# Patient Record
Sex: Female | Born: 1975
Health system: Southern US, Community
[De-identification: ages and names within clinical notes are randomized; demographics above are authoritative.]

## PROBLEM LIST (undated history)

## (undated) DIAGNOSIS — R87619 Unspecified abnormal cytological findings in specimens from cervix uteri: Secondary | ICD-10-CM

## (undated) DIAGNOSIS — N946 Dysmenorrhea, unspecified: Secondary | ICD-10-CM

## (undated) DIAGNOSIS — N939 Abnormal uterine and vaginal bleeding, unspecified: Secondary | ICD-10-CM

## (undated) DIAGNOSIS — R011 Cardiac murmur, unspecified: Secondary | ICD-10-CM

## (undated) HISTORY — DX: Unspecified abnormal cytological findings in specimens from cervix uteri: R87.619

## (undated) HISTORY — DX: Cardiac murmur, unspecified: R01.1

## (undated) HISTORY — DX: Abnormal uterine and vaginal bleeding, unspecified: N93.9

## (undated) HISTORY — PX: KNEE ARTHROSCOPY: SUR90

## (undated) HISTORY — DX: Dysmenorrhea, unspecified: N94.6

---

## 2001-05-24 DIAGNOSIS — R87619 Unspecified abnormal cytological findings in specimens from cervix uteri: Secondary | ICD-10-CM

## 2001-05-24 HISTORY — DX: Unspecified abnormal cytological findings in specimens from cervix uteri: R87.619

## 2001-05-24 HISTORY — PX: COLPOSCOPY: SHX161

## 2016-12-13 ENCOUNTER — Ambulatory Visit (INDEPENDENT_AMBULATORY_CARE_PROVIDER_SITE_OTHER): Payer: 59 | Admitting: Osteopathic Medicine

## 2016-12-13 ENCOUNTER — Encounter: Payer: Self-pay | Admitting: Osteopathic Medicine

## 2016-12-13 VITALS — BP 110/63 | HR 68 | Ht 70.0 in | Wt 185.0 lb

## 2016-12-13 DIAGNOSIS — Z8639 Personal history of other endocrine, nutritional and metabolic disease: Secondary | ICD-10-CM | POA: Diagnosis not present

## 2016-12-13 DIAGNOSIS — F418 Other specified anxiety disorders: Secondary | ICD-10-CM

## 2016-12-13 DIAGNOSIS — Z9852 Vasectomy status: Secondary | ICD-10-CM | POA: Insufficient documentation

## 2016-12-13 DIAGNOSIS — E049 Nontoxic goiter, unspecified: Secondary | ICD-10-CM | POA: Diagnosis not present

## 2016-12-13 DIAGNOSIS — Z0000 Encounter for general adult medical examination without abnormal findings: Secondary | ICD-10-CM

## 2016-12-13 DIAGNOSIS — E559 Vitamin D deficiency, unspecified: Secondary | ICD-10-CM | POA: Diagnosis not present

## 2016-12-13 MED ORDER — CLONAZEPAM 0.5 MG PO TABS: 0.2500 mg | ORAL_TABLET | Freq: Two times a day (BID) | ORAL | 0 refills | Status: DC | PRN

## 2016-12-13 NOTE — Progress Notes (Signed)
HPI: Felicia Jacobson is a 41 y.o. female  who presents to [place] today, 12/13/16,  for chief complaint of:  Chief Complaint  Patient presents with  . Establish Care      Patient here for annual physical / wellness exam.  See preventive care reviewed as below.  Recent labs reviewed in detail with the patient.   Additional concerns today include:   Scalp - folliculitis on occasion, bumps which pop and drain then crust over. Never a problem before few months ago went to water park. No flaking/itching, no scalp cysts.   Onychomycosis - would like referral to podiatry, topical and po meds haven't helped   Hx thyroid nodules, no abnormal labs, mild enlarged thyroid.   Anxiety: situational, on airplanes, requests limited Rx Benzo     Past medical, surgical, social and family history reviewed: Patient Active Problem List   Diagnosis Date Noted  . S/P vasectomy 12/13/2016   No past surgical history on file. Social History  Substance Use Topics  . Smoking status: Never Smoker  . Smokeless tobacco: Never Used  . Alcohol use No   No family history on file.   Current medication list and allergy/intolerance information reviewed:   No current outpatient prescriptions on file.   No current facility-administered medications for this visit.    Allergies not on file    Review of Systems:  Constitutional:  No  fever, no chills, No recent illness, No unintentional weight changes. No significant fatigue.   HEENT: No  headache, no vision change, no hearing change, No sore throat, No  sinus pressure  Cardiac: No  chest pain, No  pressure, No palpitations, No  Orthopnea  Respiratory:  No  shortness of breath. No  Cough  Gastrointestinal: No  abdominal pain, No  nausea, No  vomiting,  No  blood in stool, No  diarrhea, No  constipation   Musculoskeletal: No new myalgia/arthralgia  Genitourinary: No  incontinence, No  abnormal genital  bleeding, No abnormal genital discharge  Skin: +Rash - scapl as per HPI, No other wounds/concerning lesions  Hem/Onc: No  easy bruising/bleeding, No  abnormal lymph node  Endocrine: No cold intolerance,  No heat intolerance. No polyuria/polydipsia/polyphagia   Neurologic: No  weakness, No  dizziness, No  slurred speech/focal weakness/facial droop  Psychiatric: No  concerns with depression, No  concerns with anxiety, No sleep problems, No mood problems  Exam:  BP 110/63   Pulse 68   Ht 5\' 10"  (1.778 m)   Wt 185 lb (83.9 kg)   LMP 11/10/2016   BMI 26.54 kg/m   Constitutional: VS see above. General Appearance: alert, well-developed, well-nourished, NAD  Eyes: Normal lids and conjunctive, non-icteric sclera  Ears, Nose, Mouth, Throat: MMM, Normal external inspection ears/nares/mouth/lips/gums. TM normal bilaterally. Pharynx/tonsils no erythema, no exudate. Nasal mucosa normal.   Neck: No masses, trachea midline. Borderline thyroid enlargement. No tenderness/mass appreciated. No lymphadenopathy  Respiratory: Normal respiratory effort. no wheeze, no rhonchi, no rales  Cardiovascular: S1/S2 normal, no murmur, no rub/gallop auscultated. RRR. No lower extremity edema.   Gastrointestinal: Nontender, no masses. No hepatomegaly, no splenomegaly. No hernia appreciated. Bowel sounds normal. Rectal exam deferred.   Musculoskeletal: Gait normal. No clubbing/cyanosis of digits.   Neurological: Normal balance/coordination. No tremor. No cranial nerve deficit on limited exam.   Skin: warm, dry, intact. No rash/ulcer. No concerning nevi or subq nodules on limited exam.  Small healing pimple-like lesion on scalp behind L ear   Psychiatric:  Normal judgment/insight. Normal mood and affect. Oriented x3.     ASSESSMENT/PLAN:   Annual physical exam - Plan: CBC, COMPLETE METABOLIC PANEL WITH GFR, Lipid panel, TSH, VITAMIN D 25 Hydroxy (Vit-D Deficiency, Fractures), Magnesium  S/P vasectomy -  husband, obviously  Enlarged thyroid - Plan: TSH, US THYROID  Vitamin D deficiency - Plan: VITAMIN D 25 Hydroxy (Vit-D Deficiency, Fractures)  History of thyroid nodule - F/U US, request records.  - Plan: US THYROID  Situational anxiety - Plan: clonazePAM (KLONOPIN) 0.5 MG tablet    Patient Instructions   T-sal (salicylic acid) shampoo 2-3 times per week  Send referral to podiatry for toenail   Labs at your convenience   US thyroid    FEMALE PREVENTIVE CARE Updated 12/13/16   ANNUAL SCREENING/COUNSELING  Diet/Exercise - HEALTHY HABITS DISCUSSED TO DECREASE CV RISK History  Smoking Status  . Never Smoker  Smokeless Tobacco  . Never Used   History  Alcohol Use No  Minimal   Depression screen PHQ 2/9 12/13/2016  Decreased Interest 0  Down, Depressed, Hopeless 0  PHQ - 2 Score 0    Domestic violence concerns - no  HTN SCREENING - [place]  Sexually active in the past year - Yes with female.  Need/want STI testing today? - no  Concerns about libido or pain with sex? - no  Plans for pregnancy? - husband s/p vascectomy  INFECTIOUS DISEASE SCREENING  HIV - does not need  GC/CT - does not need  HepC - DOB [birth year] - does not need  TB - does not need  DISEASE SCREENING  Lipid - needs  DM2 - needs  Osteoporosis - women age 70+ - does not need  CANCER SCREENING  Cervical - does not need  Breast - does not need  Lung - does not need  Colon - does not need  ADULT VACCINATION  Influenza - annual vaccine recommended  Td - booster every 10 years   Zoster - option at 50, yes at 60+   PCV13 - was not indicated  PPSV23 - was not indicated  There is no immunization history on file for this patient.   OTHER  Fall - exercise and Vit D age 33+ - does not need  Consider ASA - age 56-59 - does not need  Will request records     Visit summary with medication list and pertinent instructions was printed for patient to  review. All questions at time of visit were answered - patient instructed to contact office with any additional concerns. ER/RTC precautions were reviewed with the patient. Follow-up plan: Return in about 1 year (around 12/13/2017) for [facility] or sooner if needed .  Note: Total time spent 20 minutes on problem-based portion of visit, greater than 50% of the visit was spent face-to-face counseling and coordinating care for the following: The primary encounter diagnosis was Annual physical exam. Diagnoses of S/P vasectomy, Enlarged thyroid, Vitamin D deficiency, History of thyroid nodule, and Situational anxiety were also pertinent to this visit.Marland Kitchen

## 2016-12-13 NOTE — Patient Instructions (Addendum)
   T-sal (salicylic acid) shampoo 2-3 times per week  Send referral to podiatry for toenail   Labs at your convenience   US thyroid

## 2017-04-21 ENCOUNTER — Encounter: Payer: Self-pay | Admitting: Obstetrics & Gynecology

## 2017-04-21 ENCOUNTER — Ambulatory Visit: Payer: 59 | Admitting: Obstetrics & Gynecology

## 2017-04-21 ENCOUNTER — Other Ambulatory Visit (HOSPITAL_COMMUNITY)
Admission: RE | Admit: 2017-04-21 | Discharge: 2017-04-21 | Disposition: A | Discharge status: Home / Self Care | Payer: 59 | Source: Ambulatory Visit | Attending: Obstetrics & Gynecology | Admitting: Obstetrics & Gynecology

## 2017-04-21 ENCOUNTER — Other Ambulatory Visit: Payer: Self-pay

## 2017-04-21 VITALS — BP 120/66 | HR 72 | Resp 16 | Ht 69.0 in | Wt 187.0 lb

## 2017-04-21 DIAGNOSIS — Z124 Encounter for screening for malignant neoplasm of cervix: Secondary | ICD-10-CM | POA: Insufficient documentation

## 2017-04-21 DIAGNOSIS — Z01419 Encounter for gynecological examination (general) (routine) without abnormal findings: Secondary | ICD-10-CM | POA: Diagnosis not present

## 2017-04-21 DIAGNOSIS — N926 Irregular menstruation, unspecified: Secondary | ICD-10-CM

## 2017-04-21 LAB — POCT URINE PREGNANCY: PREG TEST UR: NEGATIVE

## 2017-04-21 NOTE — Progress Notes (Signed)
41 y.o. [id number] MarriedCaucasianF here for annual exam/new patient exam.  Cycles have never been regular.  Feels her cycles are the most regular since her youngest child was born five years ago.  Her biggest concern is irregular spotting.  She spots mid cycle for three days.  She also has associated pelvic cramping/pain that seems to accompany the spotting.  Saw provider at [facility].  Felt she did not have a thorough evaluation.  Did have ultrasound.  Has no records with her.  Would like to start evaluation again.  Used Clomid with three pregnancies.    Moved from [place] due to husband's work.  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    H/O thyroid nodules.  Has done thyroid imaging.   Patient's last menstrual period was 04/02/2017.          Sexually active: Yes.    The current method of family planning is vasectomy.    Exercising: Yes.    burn boot camp, peloton bike Smoker:  no  Health Maintenance: Pap:  02/2016 normal  History of abnormal Pap:  Yes, normal  MMG:  Never TDaP:  ~ 5 years ago.  Hep C testing: not indicated Screening Labs: Screening labs, Hormone levels.   reports that  has never smoked. she has never used smokeless tobacco. She reports that she does not drink alcohol or use drugs.  Past Medical History:  Diagnosis Date  . Abnormal Pap smear of cervix 2003  . Abnormal uterine bleeding   . Dysmenorrhea   . Heart murmur     Past Surgical History:  Procedure Laterality Date  . COLPOSCOPY  2003  . KNEE ARTHROSCOPY      Current Outpatient Medications  Medication Sig Dispense Refill  . diphenhydrAMINE (BENADRYL) 25 MG tablet Take 25 mg by mouth every 8 (eight) hours as needed.    Marland Kitchen L-Theanine 100 MG CAPS Take by mouth daily.    . magnesium 30 MG tablet Take 30 mg by mouth daily.     No current facility-administered medications for this visit.     Family History  Problem Relation Age of Onset  . Hyperlipidemia Mother   .  Hypertension Mother   . Cancer Father   . Hyperlipidemia Father   . Hypertension Father   . Multiple myeloma Father     ROS:  Pertinent items are noted in HPI.  Otherwise, a comprehensive ROS was negative.  Exam:   BP 120/66 (BP Location: Right Arm, Patient Position: Sitting, Cuff Size: Normal)   Pulse 72   Resp 16   Ht _0  (1.753 m)   Wt 187 lb (84.8 kg)   LMP 04/02/2017   BMI 27.62 kg/m     Height: _1  (175.3 cm)  Ht Readings from Last 3 Encounters:  04/21/17 _2  (1.753 m)  12/13/16 _3  (1.778 m)    General appearance: alert, cooperative and appears stated age Head: Normocephalic, without obvious abnormality, atraumatic Neck: no adenopathy, supple, symmetrical, trachea midline and thyroid normal to inspection and palpation (I do not palpate enlarged or nodular thyroid today) Lungs: clear to auscultation bilaterally Breasts: normal appearance, no masses or tenderness Heart: regular rate and rhythm Abdomen: soft, non-tender; bowel sounds normal; no masses,  no organomegaly Extremities: extremities normal, atraumatic, no cyanosis or edema Skin: Skin color, texture, turgor normal. No rashes or lesions Lymph nodes: Cervical, supraclavicular, and axillary nodes normal. No abnormal inguinal nodes palpated Neurologic: Grossly normal   Pelvic:  External genitalia:  no lesions              Urethra:  normal appearing urethra with no masses, tenderness or lesions              Bartholins and Skenes: normal                 Vagina: normal appearing vagina with normal color and discharge, no lesions              Cervix: no lesions              Pap taken: Yes.   Bimanual Exam:  Uterus:  normal size, contour, position, consistency, mobility, non-tender              Adnexa: normal adnexa and no mass, fullness, tenderness               Rectovaginal: Confirms               Anus:  normal sphincter tone, no lesions  Chaperone was present for exam.  A:  Well Woman with normal  exam Mid cycle spotting with increased cramping Thyroid nodules  P:   Mammogram guidelines reviewed.  She will start MMG this year.  Information given.  Does not need my office to schedule. pap smear and HR HPV obtained today. Pt is going to return after cycle for SH[facility] TSH, free T4, FSH and estradiol obatined today Also, return annually or prn

## 2017-04-22 LAB — T4, FREE: Free T4: 1.28 ng/dL (ref 0.82–1.77)

## 2017-04-22 LAB — TSH: TSH: 2.52 u[IU]/mL (ref 0.450–4.500)

## 2017-04-22 LAB — FOLLICLE STIMULATING HORMONE: FSH: 2.4 m[IU]/mL

## 2017-04-22 LAB — ESTRADIOL: ESTRADIOL: 93.7 pg/mL

## 2017-04-26 LAB — CYTOLOGY - PAP
Diagnosis: NEGATIVE
HPV (WINDOPATH): NOT DETECTED

## 2017-05-09 ENCOUNTER — Telehealth: Payer: Self-pay | Admitting: Obstetrics & Gynecology

## 2017-05-09 ENCOUNTER — Telehealth: Payer: Self-pay | Admitting: *Deleted

## 2017-05-09 NOTE — Telephone Encounter (Signed)
Spoke with Felicia Jacobson. Felicia Jacobson requesting to move [facility] appt up to 12/18, currently scheduled for 12/20. Advised will have to review scheduling with Dr. Sabra Heck and return call. Felicia Jacobson is agreeable.   LMP 04/30/17 -day 12 of cycle 12/19  Dr. Sabra Heck -ok to keep [facility] as scheduled on 12/20 or schedule with next menses?

## 2017-05-09 NOTE — Telephone Encounter (Signed)
Whatever works for her is fine.  Thanks.

## 2017-05-09 NOTE — Telephone Encounter (Signed)
Left detailed message, ok per current dpr. Advised to keep [facility] scheduled for 05/12/17, no availability [facility] on 12/18. Return call to office at [phone] for any additional questions.   Routing to provider for final review. Patient is agreeable to disposition. Will close encounter.

## 2017-05-09 NOTE — Telephone Encounter (Signed)
Patient has a [facility] appointment 05/12/17 with Dr.Miller. Patient is asking if it's possible to move this [facility] to tomorrow 05/10/17?

## 2017-05-09 NOTE — Telephone Encounter (Signed)
Spoke with patient. Patient request to cancel [facility] scheduled for 12/20. Advised patient to return call to office with first day of menses for scheduling SHGM. Patient verbalizes understanding and is agreeable.   Routing to provider for final review. Patient is agreeable to disposition. Will close encounter.

## 2017-05-12 ENCOUNTER — Other Ambulatory Visit: Payer: 59 | Admitting: Obstetrics & Gynecology

## 2017-05-12 ENCOUNTER — Other Ambulatory Visit: Payer: 59

## 2017-06-02 ENCOUNTER — Telehealth: Payer: Self-pay | Admitting: Obstetrics & Gynecology

## 2017-06-02 NOTE — Telephone Encounter (Signed)
Spoke with patient. Patient started her menses on 05/31/2017 and would like to schedule her SHGM. Appointment scheduled for 06/09/2017 at 1 pm. Patient is agreeable to date and time.   Routing to provider for final review. Patient agreeable to disposition. Will close encounter.

## 2017-06-02 NOTE — Telephone Encounter (Signed)
Patient says she is needing to schedule a sonohysterogram.

## 2017-06-07 ENCOUNTER — Telehealth: Payer: Self-pay

## 2017-06-07 DIAGNOSIS — N926 Irregular menstruation, unspecified: Secondary | ICD-10-CM

## 2017-06-07 NOTE — Telephone Encounter (Signed)
Erroneous encounter

## 2017-06-09 ENCOUNTER — Other Ambulatory Visit: Payer: Self-pay | Admitting: Obstetrics & Gynecology

## 2017-06-09 ENCOUNTER — Ambulatory Visit (INDEPENDENT_AMBULATORY_CARE_PROVIDER_SITE_OTHER): Payer: 59 | Admitting: Obstetrics & Gynecology

## 2017-06-09 ENCOUNTER — Ambulatory Visit (INDEPENDENT_AMBULATORY_CARE_PROVIDER_SITE_OTHER): Payer: 59

## 2017-06-09 DIAGNOSIS — N926 Irregular menstruation, unspecified: Secondary | ICD-10-CM

## 2017-06-09 DIAGNOSIS — D251 Intramural leiomyoma of uterus: Secondary | ICD-10-CM

## 2017-06-09 NOTE — Progress Notes (Signed)
Pt left prior to consultation due to child need issue.  She was called with results.  Findings reviewed including fibroids.  Treatment options with OCPs, POPs, progesterone options, Mirena IUD, endometrial ablation, hysterectomy.  She does not want to do anything at this time.  Expressed appreciation of phone call.  Will consider and let me know if she changes her mind.

## 2017-06-11 ENCOUNTER — Encounter: Payer: Self-pay | Admitting: Obstetrics & Gynecology

## 2017-08-15 DIAGNOSIS — S46919A Strain of unspecified muscle, fascia and tendon at shoulder and upper arm level, unspecified arm, initial encounter: Secondary | ICD-10-CM | POA: Insufficient documentation

## 2018-01-25 DIAGNOSIS — M25561 Pain in right knee: Secondary | ICD-10-CM | POA: Insufficient documentation

## 2018-03-08 ENCOUNTER — Encounter: Payer: Self-pay | Admitting: Obstetrics & Gynecology

## 2018-05-05 ENCOUNTER — Other Ambulatory Visit: Payer: Self-pay | Admitting: Orthopedic Surgery

## 2018-06-16 DIAGNOSIS — I8311 Varicose veins of right lower extremity with inflammation: Secondary | ICD-10-CM | POA: Diagnosis not present

## 2018-06-16 DIAGNOSIS — I8312 Varicose veins of left lower extremity with inflammation: Secondary | ICD-10-CM | POA: Diagnosis not present

## 2018-06-29 ENCOUNTER — Ambulatory Visit: Payer: 59 | Admitting: Obstetrics & Gynecology

## 2018-06-30 ENCOUNTER — Ambulatory Visit (INDEPENDENT_AMBULATORY_CARE_PROVIDER_SITE_OTHER): Payer: BLUE CROSS/BLUE SHIELD | Admitting: Obstetrics & Gynecology

## 2018-06-30 ENCOUNTER — Encounter: Payer: Self-pay | Admitting: Obstetrics & Gynecology

## 2018-06-30 ENCOUNTER — Other Ambulatory Visit: Payer: Self-pay

## 2018-06-30 ENCOUNTER — Encounter: Payer: Self-pay | Admitting: Adult Health

## 2018-06-30 VITALS — BP 96/60 | HR 72 | Resp 16 | Ht 69.25 in | Wt 179.0 lb

## 2018-06-30 DIAGNOSIS — Z01419 Encounter for gynecological examination (general) (routine) without abnormal findings: Secondary | ICD-10-CM | POA: Diagnosis not present

## 2018-06-30 DIAGNOSIS — Z124 Encounter for screening for malignant neoplasm of cervix: Secondary | ICD-10-CM | POA: Diagnosis not present

## 2018-06-30 DIAGNOSIS — Z0000 Encounter for general adult medical examination without abnormal findings: Secondary | ICD-10-CM

## 2018-06-30 NOTE — Progress Notes (Signed)
43 y.o. [id number] Married White or Caucasian female here for annual exam.  Had an injury when doing push-ups.  Had a 50% tear in her right shoulder.  Also, had knee issues.  Had arthroscopic with a piece for bone abnormality that was removed.  This was done with Dr. Maureen Ralphs.  Is going to see provider at [facility] in a month.    Cycles are regular.   Spotting between cycles has significantly improved.  She is having dark spotting two or three days before her cycle.    Patient's last menstrual period was 06/22/2018 (exact date).          Sexually active: Yes.    The current method of family planning is vasectomy.    Exercising: Yes.    bike, weight lift  Smoker:  no  Health Maintenance: Pap:  04/21/17 Neg. HR HPV:neg  History of abnormal Pap:  yes MMG:  03/03/18 BIRADS1:Neg  TDaP:  2013 Screening Labs: here today - fasting    reports that she has never smoked. She has never used smokeless tobacco. She reports that she does not drink alcohol or use drugs.  Past Medical History:  Diagnosis Date  . Abnormal Pap smear of cervix 2003  . Abnormal uterine bleeding   . Dysmenorrhea   . Heart murmur     Past Surgical History:  Procedure Laterality Date  . COLPOSCOPY  2003  . KNEE ARTHROSCOPY Right    1997, 2014, 02/2018    Current Outpatient Medications  Medication Sig Dispense Refill  . diphenhydrAMINE (BENADRYL) 25 MG tablet Take 25 mg by mouth every 8 (eight) hours as needed.    . gabapentin (NEURONTIN) 300 MG capsule Take 1 capsule by mouth at bedtime.    . magnesium 30 MG tablet Take 30 mg by mouth daily.     No current facility-administered medications for this visit.     Family History  Problem Relation Age of Onset  . Hyperlipidemia Mother   . Hypertension Mother   . Cancer Father   . Hyperlipidemia Father   . Hypertension Father   . Multiple myeloma Father     Review of Systems  All other systems reviewed and are negative.   Exam:   BP 96/60 (BP Location: Left Arm,  Patient Position: Sitting, Cuff Size: Normal)   Pulse 72   Resp 16   Ht 5' 9.25" (1.759 m)   Wt 179 lb (81.2 kg)   LMP 06/22/2018 (Exact Date)   BMI 26.24 kg/m   Height: 5' 9.25" (175.9 cm)  Ht Readings from Last 3 Encounters:  06/30/18 5' 9.25" (1.759 m)  04/21/17 '5\' 9"'  (1.753 m)  12/13/16 '5\' 10"'  (1.778 m)    General appearance: alert, cooperative and appears stated age Head: Normocephalic, without obvious abnormality, atraumatic Neck: no adenopathy, supple, symmetrical, trachea midline and thyroid normal to inspection and palpation Lungs: clear to auscultation bilaterally Breasts: normal appearance, no masses or tenderness Heart: regular rate and rhythm Abdomen: soft, non-tender; bowel sounds normal; no masses,  no organomegaly Extremities: extremities normal, atraumatic, no cyanosis or edema Skin: Skin color, texture, turgor normal. No rashes or lesions Lymph nodes: Cervical, supraclavicular, and axillary nodes normal. No abnormal inguinal nodes palpated Neurologic: Grossly normal   Pelvic: External genitalia:  no lesions              Urethra:  normal appearing urethra with no masses, tenderness or lesions              Bartholins  and Skenes: normal                 Vagina: normal appearing vagina with normal color and discharge, no lesions              Cervix: no lesions              Pap taken: No. Bimanual Exam:  Uterus:  normal size, contour, position, consistency, mobility, non-tender              Adnexa: normal adnexa and no mass, fullness, tenderness               Rectovaginal: Confirms               Anus:  normal sphincter tone, no lesions  Chaperone was present for exam.  A:  Well Woman with normal exam H/O midcycle spotting that has significantly improved Two small intramural fibroids H/O thyroid nodules Partial rotator cuff repair Right knee pain  P:   Mammogram guidelines reviewed.   pap smear with neg HR HPV 11/18.  Not indicated. Lab work obtained  today:  Vit D, CBC, CMP, lipids TCM for breast cancer showed risk to be slightly greater than 20%.  D/w pt increased screening with MRI.  She is interested in this.  Does not meet criteria at this time for genetic testing.  (two grandparents on opposite sides of family with breast cancer after age 94).  Reviewed current recommendations with pt. Return annually or prn

## 2018-07-01 LAB — CBC
Hematocrit: 41.3 % (ref 34.0–46.6)
Hemoglobin: 13.7 g/dL (ref 11.1–15.9)
MCH: 28.8 pg (ref 26.6–33.0)
MCHC: 33.2 g/dL (ref 31.5–35.7)
MCV: 87 fL (ref 79–97)
PLATELETS: 200 10*3/uL (ref 150–450)
RBC: 4.75 x10E6/uL (ref 3.77–5.28)
RDW: 12.3 % (ref 11.7–15.4)
WBC: 5.1 10*3/uL (ref 3.4–10.8)

## 2018-07-01 LAB — COMPREHENSIVE METABOLIC PANEL
A/G RATIO: 2.1 (ref 1.2–2.2)
ALBUMIN: 4.6 g/dL (ref 3.8–4.8)
ALT: 19 IU/L (ref 0–32)
AST: 21 IU/L (ref 0–40)
Alkaline Phosphatase: 45 IU/L (ref 39–117)
BILIRUBIN TOTAL: 0.4 mg/dL (ref 0.0–1.2)
BUN/Creatinine Ratio: 21 (ref 9–23)
BUN: 16 mg/dL (ref 6–24)
CHLORIDE: 102 mmol/L (ref 96–106)
CO2: 27 mmol/L (ref 20–29)
Calcium: 9.2 mg/dL (ref 8.7–10.2)
Creatinine, Ser: 0.78 mg/dL (ref 0.57–1.00)
GFR calc non Af Amer: 95 mL/min/{1.73_m2} (ref >59)
GFR, EST AFRICAN AMERICAN: 109 mL/min/{1.73_m2} (ref >59)
GLOBULIN, TOTAL: 2.2 g/dL (ref 1.5–4.5)
Glucose: 73 mg/dL (ref 65–99)
Potassium: 4 mmol/L (ref 3.5–5.2)
Sodium: 142 mmol/L (ref 134–144)
Total Protein: 6.8 g/dL (ref 6.0–8.5)

## 2018-07-01 LAB — LIPID PANEL
CHOLESTEROL TOTAL: 193 mg/dL (ref 100–199)
Chol/HDL Ratio: 3.1 ratio (ref 0.0–4.4)
HDL: 62 mg/dL (ref >39)
LDL Calculated: 123 mg/dL — ABNORMAL HIGH (ref 0–99)
Triglycerides: 41 mg/dL (ref 0–149)
VLDL Cholesterol Cal: 8 mg/dL (ref 5–40)

## 2018-07-01 LAB — VITAMIN D 25 HYDROXY (VIT D DEFICIENCY, FRACTURES): VIT D 25 HYDROXY: 42.1 ng/mL (ref 30.0–100.0)

## 2018-07-18 DIAGNOSIS — M25561 Pain in right knee: Secondary | ICD-10-CM | POA: Diagnosis not present

## 2018-07-18 DIAGNOSIS — M1711 Unilateral primary osteoarthritis, right knee: Secondary | ICD-10-CM | POA: Diagnosis not present

## 2018-07-18 DIAGNOSIS — M25461 Effusion, right knee: Secondary | ICD-10-CM | POA: Diagnosis not present

## 2018-07-18 DIAGNOSIS — M11261 Other chondrocalcinosis, right knee: Secondary | ICD-10-CM | POA: Diagnosis not present

## 2018-08-02 DIAGNOSIS — M1711 Unilateral primary osteoarthritis, right knee: Secondary | ICD-10-CM | POA: Diagnosis not present

## 2018-08-02 DIAGNOSIS — M25461 Effusion, right knee: Secondary | ICD-10-CM | POA: Diagnosis not present

## 2018-08-02 DIAGNOSIS — M11261 Other chondrocalcinosis, right knee: Secondary | ICD-10-CM | POA: Diagnosis not present

## 2018-08-02 DIAGNOSIS — M25561 Pain in right knee: Secondary | ICD-10-CM | POA: Diagnosis not present

## 2018-08-02 DIAGNOSIS — G8929 Other chronic pain: Secondary | ICD-10-CM | POA: Diagnosis not present

## 2018-11-06 DIAGNOSIS — M1711 Unilateral primary osteoarthritis, right knee: Secondary | ICD-10-CM | POA: Diagnosis not present

## 2018-11-13 DIAGNOSIS — M25561 Pain in right knee: Secondary | ICD-10-CM | POA: Diagnosis not present

## 2018-11-13 DIAGNOSIS — M7050 Other bursitis of knee, unspecified knee: Secondary | ICD-10-CM | POA: Diagnosis not present

## 2018-11-13 DIAGNOSIS — G8929 Other chronic pain: Secondary | ICD-10-CM | POA: Diagnosis not present

## 2018-12-18 DIAGNOSIS — M25561 Pain in right knee: Secondary | ICD-10-CM | POA: Diagnosis not present

## 2018-12-18 DIAGNOSIS — M7050 Other bursitis of knee, unspecified knee: Secondary | ICD-10-CM | POA: Diagnosis not present

## 2018-12-18 DIAGNOSIS — G8929 Other chronic pain: Secondary | ICD-10-CM | POA: Diagnosis not present

## 2019-03-16 ENCOUNTER — Encounter: Payer: Self-pay | Admitting: Obstetrics & Gynecology

## 2019-03-16 DIAGNOSIS — Z1231 Encounter for screening mammogram for malignant neoplasm of breast: Secondary | ICD-10-CM | POA: Diagnosis not present

## 2019-03-16 DIAGNOSIS — Z803 Family history of malignant neoplasm of breast: Secondary | ICD-10-CM | POA: Diagnosis not present

## 2019-03-27 DIAGNOSIS — D229 Melanocytic nevi, unspecified: Secondary | ICD-10-CM | POA: Diagnosis not present

## 2019-03-27 DIAGNOSIS — L700 Acne vulgaris: Secondary | ICD-10-CM | POA: Diagnosis not present

## 2019-03-27 DIAGNOSIS — Z1283 Encounter for screening for malignant neoplasm of skin: Secondary | ICD-10-CM | POA: Diagnosis not present

## 2019-05-07 DIAGNOSIS — R609 Edema, unspecified: Secondary | ICD-10-CM | POA: Diagnosis not present

## 2019-05-25 HISTORY — PX: OTHER SURGICAL HISTORY: SHX169

## 2019-05-27 DIAGNOSIS — Z20828 Contact with and (suspected) exposure to other viral communicable diseases: Secondary | ICD-10-CM | POA: Diagnosis not present

## 2019-06-01 DIAGNOSIS — G44201 Tension-type headache, unspecified, intractable: Secondary | ICD-10-CM | POA: Diagnosis not present

## 2019-06-01 DIAGNOSIS — J069 Acute upper respiratory infection, unspecified: Secondary | ICD-10-CM | POA: Diagnosis not present

## 2019-07-13 ENCOUNTER — Other Ambulatory Visit: Payer: Self-pay

## 2019-07-16 ENCOUNTER — Other Ambulatory Visit: Payer: Self-pay

## 2019-07-16 ENCOUNTER — Ambulatory Visit (INDEPENDENT_AMBULATORY_CARE_PROVIDER_SITE_OTHER): Payer: BC Managed Care – PPO | Admitting: Obstetrics & Gynecology

## 2019-07-16 ENCOUNTER — Other Ambulatory Visit (HOSPITAL_COMMUNITY)
Admission: RE | Admit: 2019-07-16 | Discharge: 2019-07-16 | Disposition: A | Discharge status: Home / Self Care | Payer: BC Managed Care – PPO | Source: Ambulatory Visit | Attending: Obstetrics & Gynecology | Admitting: Obstetrics & Gynecology

## 2019-07-16 ENCOUNTER — Telehealth: Payer: Self-pay | Admitting: *Deleted

## 2019-07-16 ENCOUNTER — Encounter: Payer: Self-pay | Admitting: Obstetrics & Gynecology

## 2019-07-16 VITALS — BP 116/60 | HR 76 | Temp 97.9°F | Resp 12 | Ht 68.75 in | Wt 186.0 lb

## 2019-07-16 DIAGNOSIS — Z124 Encounter for screening for malignant neoplasm of cervix: Secondary | ICD-10-CM | POA: Insufficient documentation

## 2019-07-16 DIAGNOSIS — Z01419 Encounter for gynecological examination (general) (routine) without abnormal findings: Secondary | ICD-10-CM | POA: Diagnosis not present

## 2019-07-16 DIAGNOSIS — R6882 Decreased libido: Secondary | ICD-10-CM | POA: Diagnosis not present

## 2019-07-16 DIAGNOSIS — Z8639 Personal history of other endocrine, nutritional and metabolic disease: Secondary | ICD-10-CM

## 2019-07-16 MED ORDER — NITROFURANTOIN MONOHYD MACRO 100 MG PO CAPS: ORAL_CAPSULE | ORAL | 2 refills | Status: DC

## 2019-07-16 NOTE — Telephone Encounter (Signed)
Order placed for thyroid US.  Call placed to [facility], spoke with Clarise Cruz. Patient scheduled for Thyroid US on 07/19/19 at 4pm, arrive at 3:40pm. [address] location.   Call to patient, left detailed message, ok per dpr. Advised of appt as seen above, return call to office if any additional questions. If you need to make any changes to your appt contact [facility] directly at [phone].   Patient placed in [facility] hold.   Routing to provider for final review. Patient is agreeable to disposition. Will close encounter.  Cc: Magdalene Patricia

## 2019-07-16 NOTE — Progress Notes (Signed)
44 y.o. [id number] Married White or Caucasian female here for annual exam.  Did have a rotator cuff tear in her shoulder.  Did PT and things are so much better.  Also was having knee problems.  Saw a person at [facility].  Doing exercises.  This is much better.     Exercising with a pelaton bike and treadmill.    Cycles are regular.  Mid cycle spotting has resolved this past year.  Flow lasts about 3 days.    Having increased frequency of UTIs.  Inset is fast.  Voiding after intercourse used to be enough.  This is not enough now and she is having UTIs that are related to intercourse.  Is going to [facility] or other [facility] like locations when having UTIs.  When urine cultures are done, they are usually e coli positive urine.    Having some decreased libido that she thinks is related to concerns about UTI but would like hormonal testing.    Patient's last menstrual period was 07/08/2019.          Sexually active: Yes.    The current method of family planning is vasectomy.    Exercising: Yes.    pelaton bike, walking, weights Smoker:  no  Health Maintenance: Pap:  04/21/17 Neg. HR HPV:neg  History of abnormal Pap:  yes MMG:  03/16/19 BIRADS 1 negative/density b TDaP:  2013 Screening Labs: PCP -- patient would like to check hormone levels   reports that she has never smoked. She has never used smokeless tobacco. She reports that she does not drink alcohol or use drugs.  Past Medical History:  Diagnosis Date  . Abnormal Pap smear of cervix 2003  . Abnormal uterine bleeding   . Dysmenorrhea   . Heart murmur     Past Surgical History:  Procedure Laterality Date  . COLPOSCOPY  2003  . KNEE ARTHROSCOPY Right    1997, 2014, 02/2018    Current Outpatient Medications  Medication Sig Dispense Refill  . diphenhydrAMINE (BENADRYL) 25 MG tablet Take 25 mg by mouth every 8 (eight) hours as needed.    . tretinoin (RETIN-A) 0.025 % cream Apply a thin layer to the face nightly as tolerated.     No  current facility-administered medications for this visit.    Family History  Problem Relation Age of Onset  . Hyperlipidemia Mother   . Hypertension Mother   . Cancer Father   . Hyperlipidemia Father   . Hypertension Father   . Multiple myeloma Father   . Breast cancer Maternal Grandmother 58  . Breast cancer Paternal Grandmother 36    Review of Systems  All other systems reviewed and are negative.   Exam:   BP 116/60 (BP Location: Right Arm, Patient Position: Sitting, Cuff Size: Normal)   Pulse 76   Temp 97.9 F (36.6 C) (Temporal)   Resp 12   Ht 5' 8.75" (1.746 m)   Wt 186 lb (84.4 kg)   LMP 07/08/2019   BMI 27.67 kg/m     Height: 5' 8.75" (174.6 cm)  Ht Readings from Last 3 Encounters:  07/16/19 5' 8.75" (1.746 m)  06/30/18 5' 9.25" (1.759 m)  04/21/17 '5\' 9"'  (1.753 m)   General appearance: alert, cooperative and appears stated age Head: Normocephalic, without obvious abnormality, atraumatic Neck: no adenopathy, supple, symmetrical, trachea midline and thyroid normal to inspection and palpation Lungs: clear to auscultation bilaterally Breasts: normal appearance, no masses or tenderness Heart: regular rate and rhythm Abdomen:  soft, non-tender; bowel sounds normal; no masses,  no organomegaly Extremities: extremities normal, atraumatic, no cyanosis or edema Skin: Skin color, texture, turgor normal. No rashes or lesions Lymph nodes: Cervical, supraclavicular, and axillary nodes normal. No abnormal inguinal nodes palpated Neurologic: Grossly normal   Pelvic: External genitalia:  no lesions              Urethra:  normal appearing urethra with no masses, tenderness or lesions              Bartholins and Skenes: normal                 Vagina: normal appearing vagina with normal color and discharge, no lesions              Cervix: no lesions              Pap taken: Yes.   Bimanual Exam:  Uterus:  normal size, contour, position, consistency, mobility, non-tender               Adnexa: normal adnexa and no mass, fullness, tenderness               Rectovaginal: Confirms               Anus:  normal sphincter tone, no lesions  Chaperone, Terence Lux, CMA, was present for exam.  A:  Well Woman with normal exam H/o midcycle spotting that has resolved H/o intramural fibroids H/o thyroid nodules Family hx of breast cancer in MGM and PGM  P:   Mammogram guidelines reviewed.  Limited/abbreviated breast MRI discussed with pt.  Tyrer Cusick model risk is 13.6% pap smear and HR HPV obtained today Thyroid ultrasound ordered for pt Estradiol, total testosterone, progesterone level obtained today Rx for macrobid 14m post coitally.  #30/3RF. Return annually or prn

## 2019-07-16 NOTE — Telephone Encounter (Signed)
-----   Message from Megan Salon, MD sent at 07/16/2019  1:51 PM EST ----- Regarding: thyroid ultrasound Felicia Jacobson, Could you schedule a thyroid ultrasound for this pt.  H/o thyroid nodules.  Afternoon appts are better.  Thanks.  Vinnie Level

## 2019-07-17 LAB — CYTOLOGY - PAP
Adequacy: ABSENT
Comment: NEGATIVE
Diagnosis: NEGATIVE
High risk HPV: NEGATIVE

## 2019-07-17 LAB — ESTRADIOL: Estradiol: 86 pg/mL

## 2019-07-17 LAB — FOLLICLE STIMULATING HORMONE: FSH: 3.7 m[IU]/mL

## 2019-07-18 ENCOUNTER — Other Ambulatory Visit: Payer: Self-pay

## 2019-07-18 ENCOUNTER — Encounter: Payer: Self-pay | Admitting: Osteopathic Medicine

## 2019-07-18 ENCOUNTER — Ambulatory Visit (INDEPENDENT_AMBULATORY_CARE_PROVIDER_SITE_OTHER): Payer: BC Managed Care – PPO | Admitting: Osteopathic Medicine

## 2019-07-18 VITALS — BP 98/66 | HR 75 | Temp 97.4°F | Ht 68.0 in | Wt 188.4 lb

## 2019-07-18 DIAGNOSIS — L608 Other nail disorders: Secondary | ICD-10-CM

## 2019-07-18 DIAGNOSIS — Z0000 Encounter for general adult medical examination without abnormal findings: Secondary | ICD-10-CM | POA: Diagnosis not present

## 2019-07-18 DIAGNOSIS — E559 Vitamin D deficiency, unspecified: Secondary | ICD-10-CM

## 2019-07-18 DIAGNOSIS — E049 Nontoxic goiter, unspecified: Secondary | ICD-10-CM | POA: Diagnosis not present

## 2019-07-18 DIAGNOSIS — Z9189 Other specified personal risk factors, not elsewhere classified: Secondary | ICD-10-CM

## 2019-07-18 DIAGNOSIS — Z807 Family history of other malignant neoplasms of lymphoid, hematopoietic and related tissues: Secondary | ICD-10-CM

## 2019-07-18 MED ORDER — GABAPENTIN (ONCE-DAILY) 300 MG PO TABS: 150.0000 mg | ORAL_TABLET | Freq: Every evening | ORAL | 1 refills | Status: DC | PRN

## 2019-07-18 NOTE — Patient Instructions (Addendum)
General Preventive Care  Most recent routine screening lipids/other labs: ordered  Everyone should have blood pressure checked once per year.   Tobacco: don't!  Alcohol: responsible moderation is ok for most adults - if you have concerns about your alcohol intake, please talk to me!   Exercise: as tolerated to reduce risk of cardiovascular disease and diabetes. Strength training will also prevent osteoporosis.   Mental health: if need for mental health care (medicines, counseling, other), or concerns about moods, please let me know!   Sexual health: if need for STD testing, or if concerns with libido/pain problems, please let me know! If you need to discuss your birth control options, please let me know!   Advanced Directive: Living Will and/or Healthcare Power of Attorney recommended for all adults, regardless of age or health.  Vaccines  Flu vaccine: recommended for almost everyone, every fall.   Shingles vaccine: Shingrix recommended after age 83.   Pneumonia vaccines: Prevnar and Pneumovax recommended after age 39, or sooner if certain medical conditions.  Tetanus booster: Tdap recommended every 10 years.  Cancer screenings   Colon cancer screening: recommended for everyone at age 39, but some folks need a colonoscopy sooner if risk factors   Breast cancer screening: mammogram recommended at age 36 every other year at least, and annually after age 73.   Cervical cancer screening: Pap every 1 to 5 years depending on age and other risk factors. Can usually stop at age 7 or w/ hysterectomy.   Lung cancer screening: not needed for non-smokers  Infection screenings . HIV: recommended screening at least once age 52-65, more often as needed. . Gonorrhea/Chlamydia: screening as needed . Hepatitis C: recommended for anyone born 35-1964 . TB: certain at-risk populations, or depending on work requirements and/or travel history Other . Bone Density Test: recommended for women at  age 3

## 2019-07-18 NOTE — Progress Notes (Signed)
HPI: Felicia Jacobson is a 44 y.o. female who  has a past medical history of Abnormal Pap smear of cervix (2003), Abnormal uterine bleeding, Dysmenorrhea, and Heart murmur.  she presents to [facility] today, 07/18/19,  for chief complaint of: Annual physical   Patient here for annual physical / wellness exam.  See preventive care reviewed as below.   Additional concerns today include:  Lack of sleep becoming an issue, she reports waking up around 2 or 4 and unable to get back to sleep. Previously on gabapentin and this helped     Past medical, surgical, social and family history reviewed:  Patient Active Problem List   Diagnosis Date Noted  . Pain in right knee 01/25/2018  . Shoulder strain 08/15/2017  . S/P vasectomy 12/13/2016  . Vitamin D deficiency 12/13/2016  . Enlarged thyroid 12/13/2016   Past Surgical History:  Procedure Laterality Date  . COLPOSCOPY  2003  . KNEE ARTHROSCOPY Right    1997, 2014, 02/2018   Social History   Tobacco Use  . Smoking status: Never Smoker  . Smokeless tobacco: Never Used  Substance Use Topics  . Alcohol use: No   Family History  Problem Relation Age of Onset  . Hyperlipidemia Mother   . Hypertension Mother   . Cancer Father   . Hyperlipidemia Father   . Hypertension Father   . Multiple myeloma Father   . Breast cancer Maternal Grandmother 20  . Breast cancer Paternal Grandmother 37     Current medication list and allergy/intolerance information reviewed:    Current Outpatient Medications  Medication Sig Dispense Refill  . tretinoin (RETIN-A) 0.025 % cream Apply a thin layer to the face nightly as tolerated.    . diphenhydrAMINE (BENADRYL) 25 MG tablet Take 25 mg by mouth every 8 (eight) hours as needed.     No current facility-administered medications for this visit.   No Known Allergies      Review of Systems:  Constitutional:  No  fever, no chills, No recent illness, No  unintentional weight changes. No significant fatigue.   HEENT: No  headache, no vision change  Cardiac: No  chest pain, No  pressure, No palpitations, No  Orthopnea  Respiratory:  No  shortness of breath. No  Cough  Gastrointestinal: No  abdominal pain, No  nausea  Musculoskeletal: No new myalgia/arthralgia  Skin: No  Rash, No other wounds/concerning lesions  Hem/Onc: No  easy bruising/bleeding, No  abnormal lymph node  Endocrine: No cold intolerance,  No heat intolerance. No polyuria/polydipsia/polyphagia   Neurologic: No  weakness, No  dizziness  Psychiatric: No  concerns with depression, No  concerns with anxiety, +sleep problems, No mood problems  Exam:  BP 98/66   Pulse 75   Temp (!) 97.4 F (36.3 C)   Ht '5\' 8"'  (1.727 m)   Wt 188 lb 6.4 oz (85.5 kg)   LMP 07/08/2019   SpO2 98%   BMI 28.65 kg/m   Constitutional: VS see above. General Appearance: alert, well-developed, well-nourished, NAD  Eyes: Normal lids and conjunctive, non-icteric sclera  Ears, Nose, Mouth, Throat: TM normal bilaterally.  Neck: No masses, trachea midline. No thyroid enlargement. No tenderness/mass appreciated. No lymphadenopathy  Respiratory: Normal respiratory effort. no wheeze, no rhonchi, no rales  Cardiovascular: S1/S2 normal, no murmur, no rub/gallop auscultated. RRR. No lower extremity edema.  Gastrointestinal: Nontender, no masses. No hepatomegaly, no splenomegaly. No hernia appreciated. Bowel sounds normal. Rectal exam deferred.   Musculoskeletal: Gait  normal. No clubbing/cyanosis of digits.   Neurological: Normal balance/coordination. No tremor. No cranial nerve deficit on limited exam. Motor and sensation intact and symmetric. Cerebellar reflexes intact.   Skin: warm, dry, intact. No rash/ulcer. No concerning nevi or subq nodules on limited exam.    Psychiatric: Normal judgment/insight. Normal mood and affect. Oriented x3.    Results for orders placed or performed in visit on  07/16/19 (from the past 72 hour(s))  Cytology - PAP( [place])     Status: None   Collection Time: 07/16/19  1:39 PM  Result Value Ref Range   High risk HPV Negative    Adequacy      Satisfactory for evaluation; transformation zone component ABSENT.   Diagnosis      - Negative for intraepithelial lesion or malignancy (NILM)   Comment Normal Reference Range HPV - Negative   Follicle stimulating hormone     Status: None   Collection Time: 07/16/19  2:30 PM  Result Value Ref Range   FSH 3.7 mIU/mL    Comment:                     Adult Female:                       Follicular phase      3.5 -  12.5                       Ovulation phase       4.7 -  21.5                       Luteal phase          1.7 -   7.7                       Postmenopausal       25.8 - 134.8   Estradiol     Status: None   Collection Time: 07/16/19  2:30 PM  Result Value Ref Range   Estradiol 86.0 pg/mL    Comment:                     Adult Female:                       Follicular phase   08.1 -   166.0                       Ovulation phase    85.8 -   498.0                       Luteal phase       43.8 -   211.0                       Postmenopausal     <6.0 -    54.7                     Pregnancy                       1st trimester     215.0 - >4300.0 Roche ECLIA methodology     No results found.           ASSESSMENT/PLAN: The primary encounter  diagnosis was Annual physical exam. Diagnoses of Family history of multiple myeloma, Vitamin D deficiency, Enlarged thyroid, Relies on partner vasectomy for contraception, and Toenail deformity were also pertinent to this visit.  Orders Placed This Encounter  Procedures  . CBC  . COMPLETE METABOLIC PANEL WITH GFR  . Lipid panel  . TSH  . VITAMIN D 25 Hydroxy (Vit-D Deficiency, Fractures)  . Ambulatory referral to [facility] ordered this encounter  Medications  . Gabapentin, Once-Daily, 300 MG TABS    Sig: Take 150 mg by mouth at bedtime as  needed.    Dispense:  90 tablet    Refill:  1   Patient Instructions  General Preventive Care  Most recent routine screening lipids/other labs: ordered  Everyone should have blood pressure checked once per year.   Tobacco: don't!3Alcohol: responsible moderation is ok for most adults - if you have concerns about your alcohol intake, please talk to me!   Exercise: as tolerated to reduce risk of cardiovascular disease and diabetes. Strength training will also prevent osteoporosis.   Mental health: if need for mental health care (medicines, counseling, other), or concerns about moods, please let me know!   Sexual health: if need for STD testing, or if concerns with libido/pain problems, please let me know! If you need to discuss your birth control options, please let me know!   Advanced Directive: Living Will and/or Healthcare Power of Attorney recommended for all adults, regardless of age or health.  Vaccines  Flu vaccine: recommended for almost everyone, every fall.   Shingles vaccine: Shingrix recommended after age 71.   Pneumonia vaccines: Prevnar and Pneumovax recommended after age 45, or sooner if certain medical conditions.  Tetanus booster: Tdap recommended every 10 years.  Cancer screenings   Colon cancer screening: recommended for everyone at age 68, but some folks need a colonoscopy sooner if risk factors   Breast cancer screening: mammogram recommended at age 5 every other year at least, and annually after age 75.   Cervical cancer screening: Pap every 1 to 5 years depending on age and other risk factors. Can usually stop at age 103 or w/ hysterectomy.   Lung cancer screening: not needed for non-smokers  Infection screenings . HIV: recommended screening at least once age 53-65, more often as needed. . Gonorrhea/Chlamydia: screening as needed . Hepatitis C: recommended for anyone born 73-1964 . TB: certain at-risk populations, or depending on work requirements  and/or travel history Other . Bone Density Test: recommended for women at age 66        Visit summary with medication list and pertinent instructions was printed for patient to review. All questions at time of visit were answered - patient instructed to contact office with any additional concerns or updates. ER/RTC precautions were revi  Please note: voice recognition software was used to produce this document, and typos may escape review. Please contact Dr. Sheppard Coil for any needed clarifications.     Follow-up plan: Return in about 1 year (around 07/17/2020) for [place] (call week prior to visit for lab orders).

## 2019-07-19 ENCOUNTER — Telehealth: Payer: Self-pay | Admitting: Osteopathic Medicine

## 2019-07-19 ENCOUNTER — Inpatient Hospital Stay: Admission: RE | Admit: 2019-07-19 | Payer: Self-pay | Source: Ambulatory Visit

## 2019-07-19 LAB — TESTOSTERONE, TOTAL, LC/MS/MS: Testosterone, total: 15.9 ng/dL

## 2019-07-19 NOTE — Telephone Encounter (Signed)
[facility] calls and states that you sent over yesterday Gabapentin 300mg  taking 150mg  daily.  Gabapentin 300mg  only comes in capsule so can't be halfed.  She called patient to let her know and she states she told you wrong and it was Gabapentin 600mg  and she halfed these to make 300mg .  Can you send over that dose.  KG LPN

## 2019-07-20 MED ORDER — GABAPENTIN 600 MG PO TABS: 300.0000 mg | ORAL_TABLET | Freq: Every day | ORAL | 1 refills | Status: DC

## 2019-07-20 NOTE — Telephone Encounter (Signed)
Ok sent 600 mg tabs to take 300 mg qhs as needed

## 2019-07-20 NOTE — Telephone Encounter (Signed)
Forwarding to provider for review.

## 2019-07-20 NOTE — Addendum Note (Signed)
Addended by: Maryla Morrow on: 07/20/2019 12:51 PM   Modules accepted: Orders

## 2019-07-20 NOTE — Telephone Encounter (Signed)
Pharmacy called again  8:52am. Please pass any information back to the pharmacist.   [phone] option #1

## 2019-08-01 DIAGNOSIS — Z20828 Contact with and (suspected) exposure to other viral communicable diseases: Secondary | ICD-10-CM | POA: Diagnosis not present

## 2019-08-10 DIAGNOSIS — Z79899 Other long term (current) drug therapy: Secondary | ICD-10-CM | POA: Diagnosis not present

## 2019-08-10 DIAGNOSIS — M6281 Muscle weakness (generalized): Secondary | ICD-10-CM | POA: Diagnosis not present

## 2019-08-10 DIAGNOSIS — R42 Dizziness and giddiness: Secondary | ICD-10-CM | POA: Diagnosis not present

## 2019-08-10 DIAGNOSIS — R000 Tachycardia, unspecified: Secondary | ICD-10-CM | POA: Diagnosis not present

## 2019-08-10 DIAGNOSIS — Z9889 Other specified postprocedural states: Secondary | ICD-10-CM | POA: Diagnosis not present

## 2019-08-10 DIAGNOSIS — R519 Headache, unspecified: Secondary | ICD-10-CM | POA: Diagnosis not present

## 2019-08-10 DIAGNOSIS — R531 Weakness: Secondary | ICD-10-CM | POA: Diagnosis not present

## 2019-08-10 DIAGNOSIS — D6489 Other specified anemias: Secondary | ICD-10-CM | POA: Diagnosis not present

## 2019-08-10 DIAGNOSIS — D649 Anemia, unspecified: Secondary | ICD-10-CM | POA: Diagnosis not present

## 2019-08-10 DIAGNOSIS — R5381 Other malaise: Secondary | ICD-10-CM | POA: Diagnosis not present

## 2019-08-10 DIAGNOSIS — I517 Cardiomegaly: Secondary | ICD-10-CM | POA: Diagnosis not present

## 2019-08-13 ENCOUNTER — Ambulatory Visit (INDEPENDENT_AMBULATORY_CARE_PROVIDER_SITE_OTHER): Payer: BC Managed Care – PPO | Admitting: Physician Assistant

## 2019-08-13 ENCOUNTER — Encounter: Payer: Self-pay | Admitting: Physician Assistant

## 2019-08-13 VITALS — BP 117/51 | HR 90 | Temp 98.4°F

## 2019-08-13 DIAGNOSIS — R531 Weakness: Secondary | ICD-10-CM | POA: Diagnosis not present

## 2019-08-13 DIAGNOSIS — R42 Dizziness and giddiness: Secondary | ICD-10-CM | POA: Diagnosis not present

## 2019-08-13 DIAGNOSIS — R072 Precordial pain: Secondary | ICD-10-CM | POA: Diagnosis not present

## 2019-08-13 DIAGNOSIS — R110 Nausea: Secondary | ICD-10-CM | POA: Diagnosis not present

## 2019-08-13 DIAGNOSIS — D649 Anemia, unspecified: Secondary | ICD-10-CM | POA: Diagnosis not present

## 2019-08-13 DIAGNOSIS — D6489 Other specified anemias: Secondary | ICD-10-CM | POA: Diagnosis not present

## 2019-08-13 DIAGNOSIS — I728 Aneurysm of other specified arteries: Secondary | ICD-10-CM | POA: Diagnosis not present

## 2019-08-13 LAB — POCT HEMOGLOBIN: Hemoglobin: 7.8 g/dL — AB (ref 11–14.6)

## 2019-08-13 MED ORDER — GABAPENTIN 300 MG PO CAPS
300.00 | ORAL_CAPSULE | ORAL | Status: DC
Start: 2019-08-12 — End: 2019-08-13

## 2019-08-13 MED ORDER — LACTATED RINGERS IV SOLN
INTRAVENOUS | Status: DC
Start: ? — End: 2019-08-13

## 2019-08-13 MED ORDER — SODIUM CHLORIDE 0.9 % IV SOLN
250.00 | INTRAVENOUS | Status: DC
Start: ? — End: 2019-08-13

## 2019-08-13 MED ORDER — DSS 100 MG PO CAPS
100.00 | ORAL_CAPSULE | ORAL | Status: DC
Start: 2019-08-11 — End: 2019-08-13

## 2019-08-13 MED ORDER — HYDROCODONE-ACETAMINOPHEN 5-325 MG PO TABS
1.00 | ORAL_TABLET | ORAL | Status: DC
Start: ? — End: 2019-08-13

## 2019-08-13 NOTE — Patient Instructions (Signed)
Infusion clinic with call with appt.  Increase oral iron to three times a day with meals.  Follow up in 1 week with PCP.

## 2019-08-13 NOTE — Progress Notes (Signed)
Subjective:    Patient ID: Felicia Jacobson, female    DOB: [birth year], 44 y.o.   MRN: [id number]  HPI  Patient is a 44 year old female who presents to the clinic with her husband to follow-up on anemia after a liposuction procedure done in [place] on 3/17.  She had 5 L of Lipo aspirate removed from her bilateral thighs.  She did not overnight and the procedure hospital but she did go to a local hotel room where she began to feel bad the following day.  She received 2 L of LR for headache and lightheadedness.  She still did not feel better on postop day 2 and was given albumin and an additional liter of LR.  Her symptoms continued to worsen and she went to the ED on 08/10/2019.  On admission she was found to have hemoglobin of 6.9, normal renal function, low-grade tachycardia.  On further lab work she did have hypocalcemia, hypokalemia, low albumin.  She was given 1 unit of blood and her hemoglobin increased to 7.7 on discharge.   She continues to feel dizzy and weak but not worse than the hospital visit.  She is taking oral iron twice a day.  She is eating and drinking well.  She continues to feel dizzy with any ambulation.  She denies any shortness of breath, abnormal thigh pain.  Patient did follow-up with plastic surgery they said her legs do appear to be healing well with no concerns of infection.   She does admit to starting her period the day of surgery.   .. Active Ambulatory Problems    Diagnosis Date Noted  . S/P vasectomy 12/13/2016  . Vitamin D deficiency 12/13/2016  . Enlarged thyroid 12/13/2016  . Pain in right knee 01/25/2018  . Shoulder strain 08/15/2017   Resolved Ambulatory Problems    Diagnosis Date Noted  . No Resolved Ambulatory Problems   Past Medical History:  Diagnosis Date  . Abnormal Pap smear of cervix 2003  . Abnormal uterine bleeding   . Dysmenorrhea   . Heart murmur       Review of Systems See HPI.     Objective:   Physical Exam Vitals reviewed.   Constitutional:      Comments: Pale appearance.  Laying on exam table.   Cardiovascular:     Rate and Rhythm: Normal rate and regular rhythm.     Pulses: Normal pulses.  Pulmonary:     Effort: Pulmonary effort is normal.     Breath sounds: Normal breath sounds.  Abdominal:     General: There is no distension.     Tenderness: There is no abdominal tenderness. There is no right CVA tenderness, left CVA tenderness, guarding or rebound.  Musculoskeletal:     Comments: Negative homans, bilaterally.   Skin:    Comments: Swollen bilateral thighs in compression stockings.   Neurological:     General: No focal deficit present.     Mental Status: She is alert and oriented to person, place, and time.  Psychiatric:        Mood and Affect: Mood normal.           Assessment & Plan:  .Marland KitchenDessirae was seen today for hospitalization follow-up.  Diagnoses and all orders for this visit:  Anemia due to other cause, not classified -     POCT hemoglobin -     Ambulatory referral to Hematology -     CBC with Differential/Platelet -     COMPLETE METABOLIC PANEL  WITH GFR  Dizziness -     Ambulatory referral to Hematology -     CBC with Differential/Platelet -     COMPLETE METABOLIC PANEL WITH GFR  Weakness -     Ambulatory referral to Hematology -     CBC with Differential/Platelet -     COMPLETE METABOLIC PANEL WITH GFR     .Marland Kitchen Results for orders placed or performed in visit on 08/13/19  POCT hemoglobin  Result Value Ref Range   Hemoglobin 7.8 (A) 11 - 14.6 g/dL    Pt is not tachycardic today.  Diastolic is fairly low today.  Hemoglobin rechecked at 7.8 with a minor improvement from 3/20 at last blood draw.  We will check blood CBC and CMP today to evaluate electrolyte deficiencies.   I do think patient would benefit from an IV iron infusion.  Referral made to hematology today.  I do not see any signs of blood clots. Negative homans.  Discussed lack of ambulation and risk for blood  clots.  She is wearing compression full leg stockings.  Follow up in 1 week with PcP.   Spent 35 minutes with patient.

## 2019-08-14 ENCOUNTER — Encounter (HOSPITAL_BASED_OUTPATIENT_CLINIC_OR_DEPARTMENT_OTHER): Payer: Self-pay | Admitting: *Deleted

## 2019-08-14 ENCOUNTER — Emergency Department (HOSPITAL_BASED_OUTPATIENT_CLINIC_OR_DEPARTMENT_OTHER)
Admission: EM | Admit: 2019-08-14 | Discharge: 2019-08-14 | Disposition: A | Discharge status: Home / Self Care | Payer: BC Managed Care – PPO | Attending: Emergency Medicine | Admitting: Emergency Medicine

## 2019-08-14 ENCOUNTER — Telehealth: Payer: Self-pay

## 2019-08-14 ENCOUNTER — Other Ambulatory Visit: Payer: Self-pay

## 2019-08-14 ENCOUNTER — Ambulatory Visit: Payer: BC Managed Care – PPO | Admitting: Nurse Practitioner

## 2019-08-14 ENCOUNTER — Emergency Department (HOSPITAL_COMMUNITY)
Admission: EM | Admit: 2019-08-14 | Discharge: 2019-08-14 | Discharge status: Against Medical Advice | Payer: BC Managed Care – PPO

## 2019-08-14 DIAGNOSIS — Z79899 Other long term (current) drug therapy: Secondary | ICD-10-CM | POA: Diagnosis not present

## 2019-08-14 DIAGNOSIS — R5383 Other fatigue: Secondary | ICD-10-CM | POA: Diagnosis not present

## 2019-08-14 DIAGNOSIS — I517 Cardiomegaly: Secondary | ICD-10-CM | POA: Diagnosis not present

## 2019-08-14 DIAGNOSIS — R072 Precordial pain: Secondary | ICD-10-CM | POA: Diagnosis not present

## 2019-08-14 DIAGNOSIS — R42 Dizziness and giddiness: Secondary | ICD-10-CM | POA: Insufficient documentation

## 2019-08-14 LAB — CBC WITH DIFFERENTIAL/PLATELET
Absolute Monocytes: 607 cells/uL (ref 200–950)
Basophils Absolute: 21 cells/uL (ref 0–200)
Basophils Relative: 0.3 %
Eosinophils Absolute: 48 cells/uL (ref 15–500)
Eosinophils Relative: 0.7 %
HCT: 22.3 % — ABNORMAL LOW (ref 35.0–45.0)
Hemoglobin: 7.3 g/dL — ABNORMAL LOW (ref 11.7–15.5)
Lymphs Abs: 1415 cells/uL (ref 850–3900)
MCH: 29.6 pg (ref 27.0–33.0)
MCHC: 32.7 g/dL (ref 32.0–36.0)
MCV: 90.3 fL (ref 80.0–100.0)
MPV: 10.7 fL (ref 7.5–12.5)
Monocytes Relative: 8.8 %
Neutro Abs: 4809 cells/uL (ref 1500–7800)
Neutrophils Relative %: 69.7 %
Platelets: 205 10*3/uL (ref 140–400)
RBC: 2.47 10*6/uL — ABNORMAL LOW (ref 3.80–5.10)
RDW: 12.9 % (ref 11.0–15.0)
Total Lymphocyte: 20.5 %
WBC: 6.9 10*3/uL (ref 3.8–10.8)

## 2019-08-14 LAB — COMPLETE METABOLIC PANEL WITH GFR
AG Ratio: 1.6 (calc) (ref 1.0–2.5)
ALT: 17 U/L (ref 6–29)
AST: 16 U/L (ref 10–30)
Albumin: 3.1 g/dL — ABNORMAL LOW (ref 3.6–5.1)
Alkaline phosphatase (APISO): 34 U/L (ref 31–125)
BUN: 13 mg/dL (ref 7–25)
CO2: 30 mmol/L (ref 20–32)
Calcium: 8.6 mg/dL (ref 8.6–10.2)
Chloride: 103 mmol/L (ref 98–110)
Creat: 0.68 mg/dL (ref 0.50–1.10)
GFR, Est African American: 125 mL/min/{1.73_m2} (ref >=60)
GFR, Est Non African American: 108 mL/min/{1.73_m2} (ref >=60)
Globulin: 1.9 g/dL (calc) (ref 1.9–3.7)
Glucose, Bld: 100 mg/dL — ABNORMAL HIGH (ref 65–99)
Potassium: 4.2 mmol/L (ref 3.5–5.3)
Sodium: 140 mmol/L (ref 135–146)
Total Bilirubin: 0.8 mg/dL (ref 0.2–1.2)
Total Protein: 5 g/dL — ABNORMAL LOW (ref 6.1–8.1)

## 2019-08-14 NOTE — Progress Notes (Signed)
Pt called and told to go to [facility]. Spoke with Dr. Rex Kras and let her know the HPI.

## 2019-08-14 NOTE — Telephone Encounter (Signed)
Felicia Jacobson is having weakness and her last hemoglobin was 7.1. A referral was placed and the earliest appointment is Thursday. Patient went to ED at [facility] and the wait is 8-10 hours. She doesn't want to wait. I did advise her to go to the [facility] ED to at least have labs/evaluation. She verbalized understanding.

## 2019-08-14 NOTE — Progress Notes (Signed)
Spoke with Felicia Jacobson and she works for [employer] and they have looked at her and examed and they feel it is not due to the incision or incision sites.  Patient feels horrible can barely sit up.  Hematology called her and they can not see her until Thursday.  Please advise  KG LPN

## 2019-08-14 NOTE — ED Notes (Signed)
Called pt for triage no answer 

## 2019-08-14 NOTE — Progress Notes (Signed)
Felicia Jacobson,   Is there any way we could see if hematology could get her in tomorrow? Her hemoglobin continues to drop. She has appt for Thursday.

## 2019-08-14 NOTE — Progress Notes (Signed)
Call patient: blood hemoglobin was 7.3 so that would be a decrease from discharge on Saturday at 7.7.  Platelets ok. Still in normal range.  Potassium and sodium good.  Calcium back in normal range.  Protein and albumin low.   Have you been called by hematology yet?

## 2019-08-14 NOTE — Progress Notes (Signed)
I feel like she is losing blood to keep dropping. She needs to be seen by plastic to see where they think she is losing the blood if not from her incision sites. Can she get in with plastic?

## 2019-08-14 NOTE — ED Triage Notes (Addendum)
She had elective cosmetic surgery 08/08/19. She lost a large amount of blood with a hgb check of 6.9 post surgery. She was admitted and given a unit of blood. Her last hgb 4 days ago was 7.1. Pt is pale. She feels lightheaded.

## 2019-08-14 NOTE — Progress Notes (Signed)
See result note from previous entry.

## 2019-08-14 NOTE — ED Provider Notes (Signed)
[facility] EMERGENCY DEPARTMENT Provider Note   CSN: [id number] Arrival date & time: 08/14/19  1655     History Chief Complaint  Patient presents with  . Anemia    Felicia Jacobson is a 44 y.o. female.  Patient here for checking of her hemoglobin.  Patient had liposuction to her legs about 6 days ago.  Needed a transfusion recently after hemoglobin was 6.9.  She has had her hemoglobin checked several times this week and has been above 7.  Posttransfusion she states that her hemoglobin was 7.1 yesterday.  Her baseline before surgery was 13.  She still feels lightheaded but has not passed out.  Her primary care doctor had directed her to [facility] for possible transfusion but she states that the wait time was too long and came here.  She is hesitant about having any blood work drawn as she is supposed to go to a transfusion center on Thursday for likely iron transfusion of blood transfusion.  The history is provided by the patient.  Illness Onset quality:  Gradual Timing:  Constant Progression:  Unchanged Chronicity:  New Associated symptoms: fatigue   Associated symptoms: no abdominal pain, no chest pain, no cough, no ear pain, no fever, no rash, no shortness of breath, no sore throat and no vomiting        Past Medical History:  Diagnosis Date  . Abnormal Pap smear of cervix 2003  . Abnormal uterine bleeding   . Dysmenorrhea   . Heart murmur     Patient Active Problem List   Diagnosis Date Noted  . Pain in right knee 01/25/2018  . Shoulder strain 08/15/2017  . S/P vasectomy 12/13/2016  . Vitamin D deficiency 12/13/2016  . Enlarged thyroid 12/13/2016    Past Surgical History:  Procedure Laterality Date  . COLPOSCOPY  2003  . KNEE ARTHROSCOPY Right    1997, 2014, 02/2018     OB History    Gravida  4   Para  4   Term  3   Preterm  1   AB      Living  4     SAB      TAB      Ectopic      Multiple      Live Births  4            Family History  Problem Relation Age of Onset  . Hyperlipidemia Mother   . Hypertension Mother   . Cancer Father   . Hyperlipidemia Father   . Hypertension Father   . Multiple myeloma Father   . Breast cancer Maternal Grandmother 12  . Breast cancer Paternal Grandmother 93    Social History   Tobacco Use  . Smoking status: Never Smoker  . Smokeless tobacco: Never Used  Substance Use Topics  . Alcohol use: No  . Drug use: No    Home Medications Prior to Admission medications   Medication Sig Start Date End Date Taking? Authorizing Provider  diphenhydrAMINE (BENADRYL) 25 MG tablet Take 25 mg by mouth every 8 (eight) hours as needed.    [provider]  ferrous sulfate 324 MG TBEC Take 324 mg by mouth daily.    [provider]  gabapentin (NEURONTIN) 600 MG tablet Take 0.5 tablets (300 mg total) by mouth at bedtime. Patient not taking: Reported on 08/13/2019 07/20/19   Emeterio Reeve, DO  tretinoin (RETIN-A) 0.025 % cream Apply a thin layer to the face nightly as tolerated.  05/01/19   [provider]  vitamin B-12 (CYANOCOBALAMIN) 1000 MCG tablet Take 1,000 mcg by mouth daily.    [provider]    Allergies    Patient has no known allergies.  Review of Systems   Review of Systems  Constitutional: Positive for fatigue. Negative for chills and fever.  HENT: Negative for ear pain and sore throat.   Eyes: Negative for pain and visual disturbance.  Respiratory: Negative for cough and shortness of breath.   Cardiovascular: Negative for chest pain and palpitations.  Gastrointestinal: Negative for abdominal pain and vomiting.  Genitourinary: Negative for dysuria and hematuria.  Musculoskeletal: Negative for arthralgias and back pain.  Skin: Negative for color change and rash.  Neurological: Positive for light-headedness. Negative for seizures and syncope.  All other systems reviewed and are negative.   Physical Exam Updated Vital  Signs BP 114/63   Pulse 98   Temp 99.1 F (37.3 C) (Oral)   Resp 20   Ht '5\' 10"'  (1.778 m)   Wt 79.4 kg   LMP 08/03/2019   SpO2 100%   BMI 25.11 kg/m   Physical Exam Vitals and nursing note reviewed.  Constitutional:      General: She is not in acute distress.    Appearance: She is well-developed.  HENT:     Head: Normocephalic and atraumatic.     Nose: Nose normal.     Mouth/Throat:     Mouth: Mucous membranes are moist.  Eyes:     Extraocular Movements: Extraocular movements intact.     Conjunctiva/sclera: Conjunctivae normal.     Pupils: Pupils are equal, round, and reactive to light.     Comments: Pale conjuctiva  Cardiovascular:     Rate and Rhythm: Normal rate and regular rhythm.     Pulses: Normal pulses.     Heart sounds: Normal heart sounds. No murmur.  Pulmonary:     Effort: Pulmonary effort is normal. No respiratory distress.     Breath sounds: Normal breath sounds.  Abdominal:     General: Abdomen is flat. There is no distension.     Palpations: Abdomen is soft.     Tenderness: There is no abdominal tenderness.  Musculoskeletal:        General: Normal range of motion.     Cervical back: Normal range of motion and neck supple.  Skin:    General: Skin is warm and dry.     Capillary Refill: Capillary refill takes less than 2 seconds.  Neurological:     General: No focal deficit present.     Mental Status: She is alert.  Psychiatric:        Mood and Affect: Mood normal.     ED Results / Procedures / Treatments   Labs (all labs ordered are listed, but only abnormal results are displayed) Labs Reviewed - No data to display  EKG None  Radiology No results found.  Procedures Procedures (including critical care time)  Medications Ordered in ED Medications - No data to display  ED Course  I have reviewed the triage vital signs and the nursing notes.  Pertinent labs & imaging results that were available during my care of the patient were  reviewed by me and considered in my medical decision making (see chart for details).    MDM Rules/Calculators/A&P                      Felicia Jacobson is a 44 year old female who presents  to the ED for recheck of her hemoglobin.  Patient with unremarkable vitals.  No fever.  Patient had plastic surgery to her lower extremities about a week ago.  Had hemoglobin less than 7 afterwards and was admitted several days ago at [facility] for blood transfusion.  She was discharged after feeling better.  Her hemoglobin was 6.9 and after transfusion was 7.8.  Patient still has some weakness and fatigue and dizziness and went to [facility] emergency department last night.  Had a hemoglobin of 7.1.  Was seen by plastic surgery.  She had DVT work-up and PE work-up that was unremarkable.  She was discharged and her primary care doctor has arranged for her to go to transfusion clinic on Thursday.  However given that she felt still weak she talk to her primary care doctor who recommended that she go to the emergency department for repeat hemoglobin.  Overall she feels well but she still feels fatigued.  She has not had any chest pain or palpitations.  She has not had syncope.  Shared decision was made to hold off any lab work at this time as patient states that symptomatically she feels pretty stable.  She prefers to wait until Thursday for transfusing clinic.  I do not have the capability of transfusing her blood products here at this facility.  However there is not appear to be any signs of obvious acute bleeding.  This is just her body find a new equilibrium.  She is not overly tachycardic, not hypotensive.  I feel comfortable with her decision not to have any lab work done today.  She does not want to be transferred for blood transfusion does not want to be admitted at this time as well.  I think that this is reasonable and she was discharged in the ED in good condition.  She understands return precautions.  This chart  was dictated using voice recognition software.  Despite best efforts to proofread,  errors can occur which can change the documentation meaning.    Final Clinical Impression(s) / ED Diagnoses Final diagnoses:  Lightheadedness    Rx / DC Orders ED Discharge Orders    None       Lennice Sites, DO 08/14/19 1749

## 2019-08-14 NOTE — Telephone Encounter (Signed)
Agree with plan 

## 2019-08-14 NOTE — ED Notes (Signed)
Called for pt total of 3x, no answer. Will take pt out

## 2019-08-14 NOTE — Progress Notes (Signed)
Called patient and notified of results.  She has not been contacted by hematology yet and wanted to let you know she went to ED last night because of chest pain- they done labs again on her and her hemoglobin level was at 7.1 which has dropped even more.  Please advise. KG LPN

## 2019-08-15 ENCOUNTER — Other Ambulatory Visit: Payer: Self-pay | Admitting: Family

## 2019-08-15 DIAGNOSIS — E538 Deficiency of other specified B group vitamins: Secondary | ICD-10-CM

## 2019-08-15 DIAGNOSIS — D649 Anemia, unspecified: Secondary | ICD-10-CM

## 2019-08-15 NOTE — Telephone Encounter (Signed)
Yes, ok for cbc and cmp.

## 2019-08-15 NOTE — Telephone Encounter (Signed)
Ok with plan

## 2019-08-15 NOTE — Telephone Encounter (Signed)
Felicia Jacobson states she is going to wait until the appointment tomorrow. She states she feels about the same. She isn't feeling worse.

## 2019-08-15 NOTE — Telephone Encounter (Signed)
Felicitas left a message after hours stating that the wait in [facility] was also long. She wanted to know if she could get stat labs with [place] this morning.

## 2019-08-16 ENCOUNTER — Inpatient Hospital Stay: Payer: BC Managed Care – PPO

## 2019-08-16 ENCOUNTER — Inpatient Hospital Stay: Payer: BC Managed Care – PPO | Attending: Family | Admitting: Family

## 2019-08-16 ENCOUNTER — Other Ambulatory Visit: Payer: Self-pay | Admitting: *Deleted

## 2019-08-16 ENCOUNTER — Other Ambulatory Visit: Payer: Self-pay

## 2019-08-16 ENCOUNTER — Encounter: Payer: Self-pay | Admitting: Family

## 2019-08-16 VITALS — BP 113/56 | HR 83 | Temp 97.3°F | Resp 18 | Ht 68.0 in | Wt 178.0 lb

## 2019-08-16 VITALS — BP 113/63 | HR 91 | Resp 18

## 2019-08-16 DIAGNOSIS — R0602 Shortness of breath: Secondary | ICD-10-CM | POA: Insufficient documentation

## 2019-08-16 DIAGNOSIS — R531 Weakness: Secondary | ICD-10-CM | POA: Diagnosis not present

## 2019-08-16 DIAGNOSIS — D508 Other iron deficiency anemias: Secondary | ICD-10-CM | POA: Insufficient documentation

## 2019-08-16 DIAGNOSIS — D62 Acute posthemorrhagic anemia: Secondary | ICD-10-CM | POA: Diagnosis not present

## 2019-08-16 DIAGNOSIS — D649 Anemia, unspecified: Secondary | ICD-10-CM

## 2019-08-16 DIAGNOSIS — E538 Deficiency of other specified B group vitamins: Secondary | ICD-10-CM

## 2019-08-16 DIAGNOSIS — D509 Iron deficiency anemia, unspecified: Secondary | ICD-10-CM

## 2019-08-16 LAB — VITAMIN B12: Vitamin B-12: 787 pg/mL (ref 180–914)

## 2019-08-16 LAB — SAMPLE TO BLOOD BANK

## 2019-08-16 LAB — CBC WITH DIFFERENTIAL (CANCER CENTER ONLY)
Abs Immature Granulocytes: 0.17 10*3/uL — ABNORMAL HIGH (ref 0.00–0.07)
Basophils Absolute: 0 10*3/uL (ref 0.0–0.1)
Basophils Relative: 0 %
Eosinophils Absolute: 0.2 10*3/uL (ref 0.0–0.5)
Eosinophils Relative: 2 %
HCT: 25 % — ABNORMAL LOW (ref 36.0–46.0)
Hemoglobin: 7.7 g/dL — ABNORMAL LOW (ref 12.0–15.0)
Immature Granulocytes: 2 %
Lymphocytes Relative: 26 %
Lymphs Abs: 1.8 10*3/uL (ref 0.7–4.0)
MCH: 29.6 pg (ref 26.0–34.0)
MCHC: 30.8 g/dL (ref 30.0–36.0)
MCV: 96.2 fL (ref 80.0–100.0)
Monocytes Absolute: 0.5 10*3/uL (ref 0.1–1.0)
Monocytes Relative: 7 %
Neutro Abs: 4.4 10*3/uL (ref 1.7–7.7)
Neutrophils Relative %: 63 %
Platelet Count: 273 10*3/uL (ref 150–400)
RBC: 2.6 MIL/uL — ABNORMAL LOW (ref 3.87–5.11)
RDW: 15.9 % — ABNORMAL HIGH (ref 11.5–15.5)
WBC Count: 7 10*3/uL (ref 4.0–10.5)
nRBC: 0.6 % — ABNORMAL HIGH (ref 0.0–0.2)

## 2019-08-16 LAB — CMP (CANCER CENTER ONLY)
ALT: 30 U/L (ref 0–44)
AST: 25 U/L (ref 15–41)
Albumin: 3.7 g/dL (ref 3.5–5.0)
Alkaline Phosphatase: 42 U/L (ref 38–126)
Anion gap: 8 (ref 5–15)
BUN: 15 mg/dL (ref 6–20)
CO2: 30 mmol/L (ref 22–32)
Calcium: 9 mg/dL (ref 8.9–10.3)
Chloride: 102 mmol/L (ref 98–111)
Creatinine: 0.94 mg/dL (ref 0.44–1.00)
GFR, Est AFR Am: >60 mL/min (ref >60)
GFR, Estimated: >60 mL/min (ref >60)
Glucose, Bld: 109 mg/dL — ABNORMAL HIGH (ref 70–99)
Potassium: 3.8 mmol/L (ref 3.5–5.1)
Sodium: 140 mmol/L (ref 135–145)
Total Bilirubin: 1 mg/dL (ref 0.3–1.2)
Total Protein: 6.1 g/dL — ABNORMAL LOW (ref 6.5–8.1)

## 2019-08-16 LAB — LACTATE DEHYDROGENASE: LDH: 233 U/L — ABNORMAL HIGH (ref 98–192)

## 2019-08-16 LAB — SAVE SMEAR(SSMR), FOR PROVIDER SLIDE REVIEW

## 2019-08-16 LAB — RETICULOCYTES
Immature Retic Fract: 36.7 % — ABNORMAL HIGH (ref 2.3–15.9)
RBC.: 2.62 MIL/uL — ABNORMAL LOW (ref 3.87–5.11)
Retic Count, Absolute: 203.1 10*3/uL — ABNORMAL HIGH (ref 19.0–186.0)
Retic Ct Pct: 7.8 % — ABNORMAL HIGH (ref 0.4–3.1)

## 2019-08-16 LAB — PREPARE RBC (CROSSMATCH)

## 2019-08-16 LAB — FOLATE: Folate: 34 ng/mL (ref >5.9)

## 2019-08-16 MED ORDER — SODIUM CHLORIDE 0.9 % IV SOLN
200.0000 mg | Freq: Once | INTRAVENOUS | Status: AC
  Administered 2019-08-16: 200 mg via INTRAVENOUS
  Filled 2019-08-16: qty 200

## 2019-08-16 MED ORDER — SODIUM CHLORIDE 0.9 % IV SOLN
Freq: Once | INTRAVENOUS | Status: AC
  Filled 2019-08-16: qty 250

## 2019-08-16 NOTE — Progress Notes (Signed)
Hematology/Oncology Consultation   Name: Felicia Jacobson      MRN: [id number]    Location: Room/bed info not found  Date: 08/16/2019 Time:1:01 PM   REFERRING PHYSICIAN: Iran Planas, PA-C  REASON FOR CONSULT:  Anemia secondary to cute blood loss with surgery    DIAGNOSIS: Anemia secondary to acute blood loss with recent surgery  HISTORY OF PRESENT ILLNESS: Felicia Jacobson is a very pleasant 44 yo caucasian female with recent history of anemia (no prior history of anemia) after having large volume (5 L) aspiration liposuction of both knees and thighs earlier this month (08/08/2019) in [place]. She is originally from that area and moved to [place] around 4 years ago.  She required 2 L LR and 250 ml of albumin after surgery.  After coming home, she become symptomatic from her anemia and went to an ED with [facility] and was given 1 unit of blood for Hgb of 6.9.  Since then her symptoms have not gotten any better.  Hgb today is 7.7, MCV 96 and platelet count is 273. WBC count 7.0.  She is symptomatic with fatigue, weakness, headache, dizziness, palpitations, SOB with any kind of exertion, nausea and loss of appetite.  She has not noted and blood loss. No petechiae.  She still has some swelling in the lower extremities from surgery and is wearing a full compression garment. Pedal pulses are 2+.  No numbness or tingling in her extremities.  No falls or syncopal episodes.  As mentioned above, she does not have an appetite with the nausea. She is hydrating well, around 2 L a day. Her weight is stable.  No fever, chills, cough, rash, chest pain, abdominal pain or changes in bowel or bladder habits. She has had her scoped twice (1997 and 2016) without any complications.  She does not smoke, drink alcoholic beverages or use recreational drugs.  She is typically quite active. Prior to her procedure she was walking 3-5 miles 5 days a week.  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   ROS: All other 10 point review of systems  is negative.   PAST MEDICAL HISTORY:   Past Medical History:  Diagnosis Date  . Abnormal Pap smear of cervix 2003  . Abnormal uterine bleeding   . Dysmenorrhea   . Heart murmur     ALLERGIES: No Known Allergies    MEDICATIONS:  Current Outpatient Medications on File Prior to Visit  Medication Sig Dispense Refill  . diphenhydrAMINE (BENADRYL) 25 MG tablet Take 25 mg by mouth every 8 (eight) hours as needed.    . ferrous sulfate 324 MG TBEC Take 324 mg by mouth daily.    Marland Kitchen gabapentin (NEURONTIN) 600 MG tablet Take 0.5 tablets (300 mg total) by mouth at bedtime. (Patient not taking: Reported on 08/13/2019) 90 tablet 1  . tretinoin (RETIN-A) 0.025 % cream Apply a thin layer to the face nightly as tolerated.    . vitamin B-12 (CYANOCOBALAMIN) 1000 MCG tablet Take 1,000 mcg by mouth daily.     No current facility-administered medications on file prior to visit.     PAST SURGICAL HISTORY Past Surgical History:  Procedure Laterality Date  . COLPOSCOPY  2003  . KNEE ARTHROSCOPY Right    1997, 2014, 02/2018    FAMILY HISTORY: Family History  Problem Relation Age of Onset  . Hyperlipidemia Mother   . Hypertension Mother   . Cancer Father   . Hyperlipidemia Father   . Hypertension Father   . Multiple myeloma Father   .  Breast cancer Maternal Grandmother 21  . Breast cancer Paternal Grandmother 39    SOCIAL HISTORY:  reports that she has never smoked. She has never used smokeless tobacco. She reports that she does not drink alcohol or use drugs.  PERFORMANCE STATUS: The patient's performance status is 2 - Symptomatic, <50% confined to bed  PHYSICAL EXAM: Most Recent Vital Signs: Last menstrual period 08/03/2019. BP (!) 113/56 (BP Location: Left Arm, Patient Position: Sitting)   Pulse 83   Temp (!) 97.3 F (36.3 C) (Temporal)   Resp 18   Ht 5' 8" (1.727 m)   Wt 178 lb (80.7 kg)   LMP 08/03/2019   SpO2 100%   BMI 27.06 kg/m   General Appearance:    Alert,  cooperative, no distress, appears stated age  Head:    Normocephalic, without obvious abnormality, atraumatic  Eyes:    PERRL, conjunctiva/corneas clear, EOM's intact, fundi    benign, both eyes        Throat:   Lips, mucosa, and tongue normal; teeth and gums normal  Neck:   Supple, symmetrical, trachea midline, no adenopathy;    thyroid:  no enlargement/tenderness/nodules; no carotid   bruit or JVD  Back:     Symmetric, no curvature, ROM normal, no CVA tenderness  Lungs:     Clear to auscultation bilaterally, respirations unlabored  Chest Wall:    No tenderness or deformity   Heart:    Regular rate and rhythm, S1 and S2 normal, no murmur, rub   or gallop     Abdomen:     Soft, non-tender, bowel sounds active all four quadrants,    no masses, no organomegaly        Extremities:   Extremities normal, atraumatic, no cyanosis or edema  Pulses:   2+ and symmetric all extremities  Skin:   Skin color, texture, turgor normal, no rashes or lesions  Lymph nodes:   Cervical, supraclavicular, and axillary nodes normal  Neurologic:   CNII-XII intact, normal strength, sensation and reflexes    throughout    LABORATORY DATA:  No results found for this or any previous visit (from the past 48 hour(s)).    RADIOGRAPHY: No results found.     PATHOLOGY: None   ASSESSMENT/PLAN: Felicia Jacobson is a very pleasant 44 yo caucasian female with recent history of acute blood loss anemia (no prior history of anemia) after having large volume (5 L) aspiration liposuction of both knees and thighs earlier this month (08/08/2019) in [place].  Her husband was with her during her visit as well.  She is quite symptomatic as mentioned above with Hgb 7.7.  We will go ahead and give her IV iron today and 2 units of blood tomorrow.  We will plan to see her back in another 4 weeks for follow-up.   All questions were answered and she is in agreement with the plan. She will contact our office with any questions or  concerns. We can certainly see her sooner if needed.   She was discussed with and also seen by Dr. Marin Olp and he is in agreement with the aforementioned.   Laverna Peace, NP     Addendum: I saw and examined Ms. Joycelyn Man with [facility].  She is very nice.  Her husband was with her.  They are both athletes.  It was a lot of fun talking to them about their athletic endeavors when they were younger.  I am absolutely amazed as to what happened to her.  I feel bad that she had such a tough time with this liposuction.  She lost obviously a lot of blood with this procedure.  She has had 1 transfusion.  She is very symptomatic.  She is very weak.  She has very little stamina.  She is short of breath.  I did look at her blood under the microscope.  Her blood smear showed some mild anisocytosis and poikilocytosis.  There is no nucleated red blood cells.  There are no teardrop cells.  There is no rouleaux formation.  White blood cells appeared normal in morphology and maturation.  She had no hypersegmented polys.  She had no immature myeloid or lymphoid cells.  She had normal platelets.  Her vitamin B-12 level is normal at 787.  She has a slightly elevated reticulocyte count.  I would think this needs as she probably had blood loss from bleeding.  I think she will clearly benefit from a blood transfusion.  I realize that she is young.  However, her symptoms are not significant.  We will also give her a dose of iron.  I am sure her iron is also quite low.  She is very nice.  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  We will see how things go with the iron and with the blood transfusion.  She may benefit from folic acid if she had all this blood loss.  I will see a downside to giving her folic acid.  We will get her back in a month and hopefully, she will be feeling better.  We spent 45 minutes with she and her husband.  We went over her lab work.  We explained our recommendations.  She is in  agreement.  Pete Ennever, MD 

## 2019-08-16 NOTE — Patient Instructions (Signed)

## 2019-08-16 NOTE — Progress Notes (Signed)
Patient added on for Venofer.   No recent iron studies. Okay to proceed with Venofer today per Otilio Carpen, Financial Advocate.

## 2019-08-17 ENCOUNTER — Inpatient Hospital Stay: Payer: BC Managed Care – PPO

## 2019-08-17 ENCOUNTER — Telehealth: Payer: Self-pay | Admitting: Family

## 2019-08-17 DIAGNOSIS — R0602 Shortness of breath: Secondary | ICD-10-CM | POA: Diagnosis not present

## 2019-08-17 DIAGNOSIS — D508 Other iron deficiency anemias: Secondary | ICD-10-CM | POA: Diagnosis not present

## 2019-08-17 DIAGNOSIS — D62 Acute posthemorrhagic anemia: Secondary | ICD-10-CM | POA: Diagnosis not present

## 2019-08-17 DIAGNOSIS — R531 Weakness: Secondary | ICD-10-CM | POA: Diagnosis not present

## 2019-08-17 LAB — IRON AND TIBC
Iron: 107 ug/dL (ref 41–142)
Saturation Ratios: 42 % (ref 21–57)
TIBC: 258 ug/dL (ref 236–444)
UIBC: 150 ug/dL (ref 120–384)

## 2019-08-17 LAB — ABO/RH: ABO/RH(D): A POS

## 2019-08-17 LAB — FERRITIN: Ferritin: 133 ng/mL (ref 11–307)

## 2019-08-17 LAB — ERYTHROPOIETIN: Erythropoietin: 128.7 m[IU]/mL — ABNORMAL HIGH (ref 2.6–18.5)

## 2019-08-17 MED ORDER — ACETAMINOPHEN 325 MG PO TABS
ORAL_TABLET | ORAL | Status: AC
  Filled 2019-08-17: qty 2

## 2019-08-17 MED ORDER — FOLIC ACID 1 MG PO TABS: 2.0000 mg | ORAL_TABLET | Freq: Every day | ORAL | 4 refills | Status: DC

## 2019-08-17 MED ORDER — SODIUM CHLORIDE 0.9% IV SOLUTION
250.0000 mL | Freq: Once | INTRAVENOUS | Status: AC
  Administered 2019-08-17: 250 mL via INTRAVENOUS
  Filled 2019-08-17: qty 250

## 2019-08-17 MED ORDER — DIPHENHYDRAMINE HCL 25 MG PO CAPS
ORAL_CAPSULE | ORAL | Status: AC
  Filled 2019-08-17: qty 1

## 2019-08-17 MED ORDER — DIPHENHYDRAMINE HCL 25 MG PO CAPS
25.0000 mg | ORAL_CAPSULE | Freq: Once | ORAL | Status: AC
  Administered 2019-08-17: 25 mg via ORAL

## 2019-08-17 MED ORDER — ACETAMINOPHEN 325 MG PO TABS
650.0000 mg | ORAL_TABLET | Freq: Once | ORAL | Status: AC
  Administered 2019-08-17: 650 mg via ORAL

## 2019-08-17 NOTE — Telephone Encounter (Signed)
Appointments scheduled calendar printed per 3/25 los

## 2019-08-17 NOTE — Patient Instructions (Signed)

## 2019-08-17 NOTE — Progress Notes (Signed)
Patient notified per order of S. [place] NP that iron studies are WNL, so no additional IV iron is needed at this time.  Pt would like to know if she needs to continue oral iron 100 mg TID.  Pt notified to stop taking oral iron per order of S. [place] NP.

## 2019-08-19 LAB — BPAM RBC
Blood Product Expiration Date: 202104182359
Blood Product Expiration Date: 202104182359
ISSUE DATE / TIME: 202103260803
ISSUE DATE / TIME: 202103260803
Unit Type and Rh: 6200
Unit Type and Rh: 6200

## 2019-08-19 LAB — TYPE AND SCREEN
ABO/RH(D): A POS
Antibody Screen: NEGATIVE
Unit division: 0
Unit division: 0

## 2019-08-28 ENCOUNTER — Other Ambulatory Visit: Payer: Self-pay | Admitting: Hematology & Oncology

## 2019-09-01 ENCOUNTER — Encounter: Payer: Self-pay | Admitting: Family

## 2019-09-04 ENCOUNTER — Other Ambulatory Visit: Payer: Self-pay | Admitting: Hematology & Oncology

## 2019-09-17 ENCOUNTER — Telehealth: Payer: Self-pay | Admitting: *Deleted

## 2019-09-17 ENCOUNTER — Inpatient Hospital Stay: Payer: BC Managed Care – PPO | Attending: Family

## 2019-09-17 ENCOUNTER — Encounter: Payer: Self-pay | Admitting: Family

## 2019-09-17 ENCOUNTER — Other Ambulatory Visit: Payer: Self-pay

## 2019-09-17 ENCOUNTER — Inpatient Hospital Stay (HOSPITAL_BASED_OUTPATIENT_CLINIC_OR_DEPARTMENT_OTHER): Payer: BC Managed Care – PPO | Admitting: Family

## 2019-09-17 ENCOUNTER — Telehealth: Payer: Self-pay | Admitting: Family

## 2019-09-17 VITALS — BP 105/64 | HR 65 | Temp 97.3°F | Resp 18 | Ht 68.0 in | Wt 187.0 lb

## 2019-09-17 DIAGNOSIS — D508 Other iron deficiency anemias: Secondary | ICD-10-CM

## 2019-09-17 DIAGNOSIS — D62 Acute posthemorrhagic anemia: Secondary | ICD-10-CM

## 2019-09-17 LAB — CBC WITH DIFFERENTIAL (CANCER CENTER ONLY)
Abs Immature Granulocytes: 0.01 10*3/uL (ref 0.00–0.07)
Basophils Absolute: 0 10*3/uL (ref 0.0–0.1)
Basophils Relative: 0 %
Eosinophils Absolute: 0.1 10*3/uL (ref 0.0–0.5)
Eosinophils Relative: 2 %
HCT: 39.7 % (ref 36.0–46.0)
Hemoglobin: 12.5 g/dL (ref 12.0–15.0)
Immature Granulocytes: 0 %
Lymphocytes Relative: 39 %
Lymphs Abs: 1.9 10*3/uL (ref 0.7–4.0)
MCH: 29.3 pg (ref 26.0–34.0)
MCHC: 31.5 g/dL (ref 30.0–36.0)
MCV: 93.2 fL (ref 80.0–100.0)
Monocytes Absolute: 0.4 10*3/uL (ref 0.1–1.0)
Monocytes Relative: 9 %
Neutro Abs: 2.5 10*3/uL (ref 1.7–7.7)
Neutrophils Relative %: 50 %
Platelet Count: 157 10*3/uL (ref 150–400)
RBC: 4.26 MIL/uL (ref 3.87–5.11)
RDW: 13.4 % (ref 11.5–15.5)
WBC Count: 4.9 10*3/uL (ref 4.0–10.5)
nRBC: 0 % (ref 0.0–0.2)

## 2019-09-17 LAB — CMP (CANCER CENTER ONLY)
ALT: 16 U/L (ref 0–44)
AST: 16 U/L (ref 15–41)
Albumin: 4.2 g/dL (ref 3.5–5.0)
Alkaline Phosphatase: 44 U/L (ref 38–126)
Anion gap: 4 — ABNORMAL LOW (ref 5–15)
BUN: 21 mg/dL — ABNORMAL HIGH (ref 6–20)
CO2: 32 mmol/L (ref 22–32)
Calcium: 9.6 mg/dL (ref 8.9–10.3)
Chloride: 105 mmol/L (ref 98–111)
Creatinine: 0.88 mg/dL (ref 0.44–1.00)
GFR, Est AFR Am: >60 mL/min (ref >60)
GFR, Estimated: >60 mL/min (ref >60)
Glucose, Bld: 91 mg/dL (ref 70–99)
Potassium: 4.1 mmol/L (ref 3.5–5.1)
Sodium: 141 mmol/L (ref 135–145)
Total Bilirubin: 0.3 mg/dL (ref 0.3–1.2)
Total Protein: 6.4 g/dL — ABNORMAL LOW (ref 6.5–8.1)

## 2019-09-17 LAB — IRON AND TIBC
Iron: 38 ug/dL — ABNORMAL LOW (ref 41–142)
Saturation Ratios: 13 % — ABNORMAL LOW (ref 21–57)
TIBC: 295 ug/dL (ref 236–444)
UIBC: 257 ug/dL (ref 120–384)

## 2019-09-17 LAB — RETICULOCYTES
Immature Retic Fract: 7.6 % (ref 2.3–15.9)
RBC.: 4.28 MIL/uL (ref 3.87–5.11)
Retic Count, Absolute: 41.1 10*3/uL (ref 19.0–186.0)
Retic Ct Pct: 1 % (ref 0.4–3.1)

## 2019-09-17 LAB — FERRITIN: Ferritin: 115 ng/mL (ref 11–307)

## 2019-09-17 NOTE — Telephone Encounter (Signed)
-----   Message from Eliezer Bottom, NP sent at 09/17/2019  3:34 PM EDT ----- Iron saturation is a smidge low. Does she want another infusion or to try eating iron rich foods or a supplement? It is totally up to her.   Sarah ----- Message ----- From: Buel Ream, Lab [facility] [place] Sent: 09/17/2019   8:27 AM EDT To: Eliezer Bottom, NP

## 2019-09-17 NOTE — Telephone Encounter (Signed)
Per 4/26 los  No follow-up needed.

## 2019-09-17 NOTE — Progress Notes (Signed)
Hematology and Oncology Follow Up Visit  Felicia Jacobson HA:[id number] 1975/10/09 44 y.o. 09/17/2019   Principle Diagnosis:  Anemia secondary to acute blood loss with recent surgery  Current Therapy: Observation   Interim History:  Felicia Jacobson is here today for follow-up. She is doing awesome! After receiving 2 units of blood and IV iron her symptoms have resolved and she has no complaints at this time.  She has not noted any more blood loss. No bruising or petechiae.  She states that her incisions have healed nicely and she has not had any pain.  She still wears her compression garment daily. No fever, chills, n/v, cough, rash, dizziness, SOB, chest pain, palpitations, abdominal pain or changes in bowel or bladder habits.  No swelling, tenderness, numbness or tingling in her extremities.  No falls or syncope.  She has maintained a good appetite and is hydrating properly. Her weight is stable.   ECOG Performance Status: 1 - Symptomatic but completely ambulatory  Medications:  Allergies as of 09/17/2019   No Known Allergies     Medication List       Accurate as of September 17, 2019  9:23 AM. If you have any questions, ask your nurse or doctor.        STOP taking these medications   folic acid 1 MG tablet Commonly known as: FOLVITE Stopped by: Laverna Peace, NP   vitamin B-12 1000 MCG tablet Commonly known as: CYANOCOBALAMIN Stopped by: Laverna Peace, NP     TAKE these medications   diphenhydrAMINE 25 MG tablet Commonly known as: BENADRYL Take 25 mg by mouth every 8 (eight) hours as needed.   gabapentin 600 MG tablet Commonly known as: NEURONTIN Take 300 mg by mouth at bedtime.   tretinoin 0.025 % cream Commonly known as: RETIN-A Apply a thin layer to the face nightly as tolerated.       Allergies: No Known Allergies  Past Medical History, Surgical history, Social history, and Family History were reviewed and updated.  Review of Systems: All other 10  point review of systems is negative.   Physical Exam:  height is 5\' 8"  (1.727 m) and weight is 187 lb (84.8 kg). Her temporal temperature is 97.3 F (36.3 C) (abnormal). Her blood pressure is 105/64 and her pulse is 65. Her respiration is 18 and oxygen saturation is 100%.   Wt Readings from Last 3 Encounters:  09/17/19 187 lb (84.8 kg)  08/16/19 178 lb (80.7 kg)  08/14/19 175 lb (79.4 kg)    Ocular: Sclerae unicteric, pupils equal, round and reactive to light Ear-nose-throat: Oropharynx clear, dentition fair Lymphatic: No cervical or supraclavicular adenopathy Lungs no rales or rhonchi, good excursion bilaterally Heart regular rate and rhythm, no murmur appreciated Abd soft, nontender, positive bowel sounds, no liver or spleen tip palpated on exam, no fluid wave  MSK no focal spinal tenderness, no joint edema Neuro: non-focal, well-oriented, appropriate affect Breasts: Deferred   Lab Results  Component Value Date   WBC 4.9 09/17/2019   HGB 12.5 09/17/2019   HCT 39.7 09/17/2019   MCV 93.2 09/17/2019   PLT 157 09/17/2019   Lab Results  Component Value Date   FERRITIN 133 08/16/2019   IRON 107 08/16/2019   TIBC 258 08/16/2019   UIBC 150 08/16/2019   IRONPCTSAT 42 08/16/2019   Lab Results  Component Value Date   RETICCTPCT 1.0 09/17/2019   RBC 4.28 09/17/2019   RBC 4.26 09/17/2019   No results found for: KPAFRELGTCHN, LAMBDASER, KAPLAMBRATIO  No results found for: IGGSERUM, IGA, IGMSERUM No results found for: Odetta Pink, SPEI   Chemistry      Component Value Date/Time   NA 141 09/17/2019 0819   NA 142 06/30/2018 1216   K 4.1 09/17/2019 0819   CL 105 09/17/2019 0819   CO2 32 09/17/2019 0819   BUN 21 (H) 09/17/2019 0819   BUN 16 06/30/2018 1216   CREATININE 0.88 09/17/2019 0819   CREATININE 0.68 08/13/2019 1617      Component Value Date/Time   CALCIUM 9.6 09/17/2019 0819   ALKPHOS 44 09/17/2019 0819   AST  16 09/17/2019 0819   ALT 16 09/17/2019 0819   BILITOT 0.3 09/17/2019 0819       Impression and Plan: Felicia Jacobson is a very pleasant 44 yo caucasian female with acute blood loss anemia (no prior history of anemia) after having large volume (5 L) aspiration liposuction of both knees and thighs earlier this month (08/08/2019) in [place].  After receiving blood and IV iron she is doing quite well and her symptoms have resolved.  Hgb is now 12.5, MCV 93. Iron studies are pending.  At this point we can let her go from our office and follow-up only if needed.  She will contact our office with any questions or concerns. We can certainly see her again for any future heme/onc needs.     Laverna Peace, NP 4/26/20219:23 AM

## 2019-09-17 NOTE — Telephone Encounter (Signed)
Spoke with pt regarding Iron results and option of additional infusion, supplement or eating iron rich foods.   Pt unable to continue call as she was "in the middle of something" and advised she will call back to office at a later time.

## 2019-09-24 ENCOUNTER — Telehealth: Payer: Self-pay | Admitting: *Deleted

## 2019-09-24 NOTE — Telephone Encounter (Signed)
Patient is in [place] hold for Thyroid US for h/o thyroid nodules. Ordered 06/2019.   Call to patient. Patient states she still plans to proceed with Thyroid US, has been very busy. Patient states she would like to call herself and schedule. Number for [facility] provided. Patient is aware to return call to office if any additional assistance needed.

## 2019-09-26 ENCOUNTER — Encounter: Payer: Self-pay | Admitting: Family

## 2019-10-01 ENCOUNTER — Inpatient Hospital Stay: Payer: BC Managed Care – PPO | Attending: Family

## 2019-10-01 ENCOUNTER — Other Ambulatory Visit: Payer: Self-pay

## 2019-10-01 ENCOUNTER — Ambulatory Visit
Admission: RE | Admit: 2019-10-01 | Discharge: 2019-10-01 | Disposition: A | Discharge status: Home / Self Care | Payer: BC Managed Care – PPO | Source: Ambulatory Visit | Attending: Obstetrics & Gynecology | Admitting: Obstetrics & Gynecology

## 2019-10-01 VITALS — BP 113/54 | HR 63 | Temp 97.9°F | Resp 17

## 2019-10-01 DIAGNOSIS — Z8639 Personal history of other endocrine, nutritional and metabolic disease: Secondary | ICD-10-CM

## 2019-10-01 DIAGNOSIS — D509 Iron deficiency anemia, unspecified: Secondary | ICD-10-CM

## 2019-10-01 DIAGNOSIS — D62 Acute posthemorrhagic anemia: Secondary | ICD-10-CM | POA: Insufficient documentation

## 2019-10-01 DIAGNOSIS — E041 Nontoxic single thyroid nodule: Secondary | ICD-10-CM | POA: Diagnosis not present

## 2019-10-01 MED ORDER — SODIUM CHLORIDE 0.9 % IV SOLN
Freq: Once | INTRAVENOUS | Status: AC
  Filled 2019-10-01: qty 250

## 2019-10-01 MED ORDER — SODIUM CHLORIDE 0.9 % IV SOLN
200.0000 mg | Freq: Once | INTRAVENOUS | Status: AC
  Administered 2019-10-01: 200 mg via INTRAVENOUS
  Filled 2019-10-01: qty 200

## 2019-10-01 NOTE — Progress Notes (Signed)
Pt declined to stay for 30 minute post infusion observation period. Pt stated she has tolerated medication prior and aware to call clinic with any questions or concerns. Pt verbalized understanding and had no further questions.

## 2019-10-01 NOTE — Patient Instructions (Signed)

## 2019-10-03 NOTE — Telephone Encounter (Signed)
See 10/01/19 thyroid US result.  Removed from IMG hold.   Encounter closed.

## 2019-10-08 ENCOUNTER — Encounter: Payer: Self-pay | Admitting: Family

## 2019-10-08 ENCOUNTER — Inpatient Hospital Stay: Payer: BC Managed Care – PPO

## 2019-10-08 ENCOUNTER — Other Ambulatory Visit: Payer: Self-pay

## 2019-10-08 VITALS — BP 109/46 | HR 69 | Temp 97.3°F | Resp 17

## 2019-10-08 DIAGNOSIS — D62 Acute posthemorrhagic anemia: Secondary | ICD-10-CM | POA: Diagnosis not present

## 2019-10-08 DIAGNOSIS — D509 Iron deficiency anemia, unspecified: Secondary | ICD-10-CM

## 2019-10-08 MED ORDER — SODIUM CHLORIDE 0.9 % IV SOLN
Freq: Once | INTRAVENOUS | Status: AC
  Filled 2019-10-08: qty 250

## 2019-10-08 MED ORDER — SODIUM CHLORIDE 0.9 % IV SOLN
200.0000 mg | Freq: Once | INTRAVENOUS | Status: AC
  Administered 2019-10-08: 200 mg via INTRAVENOUS
  Filled 2019-10-08: qty 200

## 2019-10-08 NOTE — Progress Notes (Signed)
Pt declined to stay for 30 minute post infusion observation period. Pt stated she has tolerated medication prior without difficulty. Pt aware to call with any questions or concerns. Pt verbalized understanding and had no further questions.

## 2019-11-09 ENCOUNTER — Ambulatory Visit: Payer: BLUE CROSS/BLUE SHIELD | Admitting: Obstetrics & Gynecology

## 2019-11-16 ENCOUNTER — Other Ambulatory Visit: Payer: Self-pay

## 2019-11-16 ENCOUNTER — Encounter: Payer: Self-pay | Admitting: Podiatry

## 2019-11-16 ENCOUNTER — Ambulatory Visit (INDEPENDENT_AMBULATORY_CARE_PROVIDER_SITE_OTHER): Payer: BC Managed Care – PPO | Admitting: Podiatry

## 2019-11-16 DIAGNOSIS — B351 Tinea unguium: Secondary | ICD-10-CM

## 2019-11-16 DIAGNOSIS — L601 Onycholysis: Secondary | ICD-10-CM | POA: Diagnosis not present

## 2019-11-16 DIAGNOSIS — L608 Other nail disorders: Secondary | ICD-10-CM | POA: Diagnosis not present

## 2019-11-16 NOTE — Patient Instructions (Signed)

## 2019-11-17 DIAGNOSIS — B351 Tinea unguium: Secondary | ICD-10-CM | POA: Insufficient documentation

## 2019-11-17 NOTE — Progress Notes (Signed)
Subjective:   Patient ID: Felicia Jacobson, female   DOB: 44 y.o.   MRN: [id number]   HPI 44 year old female presents the office today for concerns of fungus in her right big toenail which did have all the nails trimmed.  She does get ingrown toenail right big toe which causes discomfort.  Currently denies any drainage or pus or any swelling to the toenail.  She does try to trim it.  She said no recent treatment for the nail fungus but thinks she may been on Lamisil previously but not sure how long she took this for..  She has no other concerns today.   Review of Systems  All other systems reviewed and are negative.  Past Medical History:  Diagnosis Date  . Abnormal Pap smear of cervix 2003  . Abnormal uterine bleeding   . Dysmenorrhea   . Heart murmur     Past Surgical History:  Procedure Laterality Date  . COLPOSCOPY  2003  . KNEE ARTHROSCOPY Right    1997, 2014, 02/2018     Current Outpatient Medications:  .  diphenhydrAMINE (BENADRYL) 25 MG tablet, Take 25 mg by mouth every 8 (eight) hours as needed., Disp: , Rfl:  .  gabapentin (NEURONTIN) 600 MG tablet, Take 300 mg by mouth at bedtime., Disp: , Rfl:  .  tretinoin (RETIN-A) 0.025 % cream, Apply a thin layer to the face nightly as tolerated., Disp: , Rfl:   No Known Allergies       Objective:  Physical Exam  General: AAO x3, NAD  Dermatological: Incurvation present to both the medial lateral aspects of the right hallux toenail with mild tenderness palpation.  There is no significant edema, erythema or any signs of infection.  The nails in general are mildly hypertrophic, dystrophic with yellow-brown discoloration.  There is no drainage or pus or other signs of infection.  Vascular: Dorsalis Pedis artery and Posterior Tibial artery pedal pulses are 2/4 bilateral with immedate capillary fill time.There is no pain with calf compression, swelling, warmth, erythema.   Neruologic: Grossly intact via light touch bilateral.   Musculoskeletal: No gross boney pedal deformities bilateral. No pain, crepitus, or limitation noted with foot and ankle range of motion bilateral. Muscular strength 5/5 in all groups tested bilateral.  Gait: Unassisted, Nonantalgic.       Assessment:   Ingrown toenail right hallux; onychoycosis     Plan:  -Treatment options discussed including all alternatives, risks, and complications -Etiology of symptoms were discussed -I discussed with a partial nail avulsion with chemical matricectomy.  She will hold off on that today.  Want to try to treat the nail fungus.  Debrided the nails but any complications I sent for culture, pathology to [facility] labs.  Specimen was given to [facility], [place].  If positive for fungus will likely do another round of Lamisil versus other oral therapy.  Return for nail fungus after culture/biopsy .  Trula Slade DPM

## 2019-12-11 ENCOUNTER — Telehealth: Payer: Self-pay | Admitting: *Deleted

## 2019-12-11 DIAGNOSIS — Z79899 Other long term (current) drug therapy: Secondary | ICD-10-CM

## 2019-12-11 NOTE — Telephone Encounter (Signed)
I informed pt Dr. Jacqualyn Posey had sent her a Mychart message informing of positive fungal culture results and treatment options. I inforemd pt if she wanted to use the Lamisil she would need to have labs prior to beginning and I would send to [facility] and she would be able to go at her convenience, once the results were received we would call with instructions. Pt states understanding.

## 2019-12-25 DIAGNOSIS — Z79899 Other long term (current) drug therapy: Secondary | ICD-10-CM | POA: Diagnosis not present

## 2019-12-25 LAB — HEPATIC FUNCTION PANEL
AG Ratio: 1.9 (calc) (ref 1.0–2.5)
ALT: 20 U/L (ref 6–29)
AST: 18 U/L (ref 10–30)
Albumin: 4.9 g/dL (ref 3.6–5.1)
Alkaline phosphatase (APISO): 47 U/L (ref 31–125)
Bilirubin, Direct: 0.2 mg/dL (ref 0.0–0.2)
Globulin: 2.6 g/dL (calc) (ref 1.9–3.7)
Indirect Bilirubin: 0.8 mg/dL (calc) (ref 0.2–1.2)
Total Bilirubin: 1 mg/dL (ref 0.2–1.2)
Total Protein: 7.5 g/dL (ref 6.1–8.1)

## 2019-12-25 LAB — CBC WITH DIFFERENTIAL/PLATELET
Absolute Monocytes: 362 cells/uL (ref 200–950)
Basophils Absolute: 20 cells/uL (ref 0–200)
Basophils Relative: 0.4 %
Eosinophils Absolute: 51 cells/uL (ref 15–500)
Eosinophils Relative: 1 %
HCT: 44.2 % (ref 35.0–45.0)
Hemoglobin: 14.6 g/dL (ref 11.7–15.5)
Lymphs Abs: 2081 cells/uL (ref 850–3900)
MCH: 28.8 pg (ref 27.0–33.0)
MCHC: 33 g/dL (ref 32.0–36.0)
MCV: 87.2 fL (ref 80.0–100.0)
MPV: 10.4 fL (ref 7.5–12.5)
Monocytes Relative: 7.1 %
Neutro Abs: 2586 cells/uL (ref 1500–7800)
Neutrophils Relative %: 50.7 %
Platelets: 191 10*3/uL (ref 140–400)
RBC: 5.07 10*6/uL (ref 3.80–5.10)
RDW: 14.2 % (ref 11.0–15.0)
Total Lymphocyte: 40.8 %
WBC: 5.1 10*3/uL (ref 3.8–10.8)

## 2019-12-26 ENCOUNTER — Other Ambulatory Visit: Payer: Self-pay | Admitting: Podiatry

## 2019-12-26 MED ORDER — TERBINAFINE HCL 250 MG PO TABS: 250.0000 mg | ORAL_TABLET | Freq: Every day | ORAL | 0 refills | Status: DC

## 2019-12-28 ENCOUNTER — Telehealth: Payer: Self-pay | Admitting: *Deleted

## 2019-12-28 NOTE — Telephone Encounter (Signed)
Left message with Dr. Leigh Aurora orders and review of results.

## 2019-12-28 NOTE — Telephone Encounter (Signed)
-----   Message from Trula Slade, DPM sent at 12/26/2019 11:39 AM EDT ----- Tivis Ringer- please let her know that I have sent the Lamisil to the pharmacy. Would like to recheck blood work in 4-6 weeks

## 2020-01-15 DIAGNOSIS — I872 Venous insufficiency (chronic) (peripheral): Secondary | ICD-10-CM | POA: Diagnosis not present

## 2020-01-15 DIAGNOSIS — I83811 Varicose veins of right lower extremities with pain: Secondary | ICD-10-CM | POA: Diagnosis not present

## 2020-01-23 ENCOUNTER — Telehealth: Payer: Self-pay | Admitting: Podiatry

## 2020-01-23 NOTE — Telephone Encounter (Signed)
Pt called and stated someone from the office called.She wanted [place] to know that she picked up the Lamisil and she is doing great. Called her is she needs to come in sooner.

## 2020-03-24 ENCOUNTER — Ambulatory Visit (INDEPENDENT_AMBULATORY_CARE_PROVIDER_SITE_OTHER): Payer: BC Managed Care – PPO | Admitting: Podiatry

## 2020-03-24 ENCOUNTER — Other Ambulatory Visit: Payer: Self-pay

## 2020-03-24 DIAGNOSIS — Z79899 Other long term (current) drug therapy: Secondary | ICD-10-CM

## 2020-03-24 DIAGNOSIS — B351 Tinea unguium: Secondary | ICD-10-CM

## 2020-03-24 MED ORDER — TERBINAFINE HCL 250 MG PO TABS
250.0000 mg | ORAL_TABLET | Freq: Every day | ORAL | 0 refills | Status: DC
Start: 2020-03-24 — End: 2020-07-21

## 2020-03-24 NOTE — Patient Instructions (Signed)
Terbinafine oral granules What is this medicine? TERBINAFINE (TER bin a feen) is an antifungal medicine. It is used to treat certain kinds of fungal or yeast infections. This medicine may be used for other purposes; ask your health care provider or pharmacist if you have questions. COMMON BRAND NAME(S): Lamisil What should I tell my health care provider before I take this medicine? They need to know if you have any of these conditions:  drink alcoholic beverages  kidney disease  liver disease  an unusual or allergic reaction to Terbinafine, other medicines, foods, dyes, or preservatives  pregnant or trying to get pregnant  breast-feeding How should I use this medicine? Take this medicine by mouth. Follow the directions on the prescription label. Hold packet with cut line on top. Shake packet gently to settle contents. Tear packet open along cut line, or use scissors to cut across line. Carefully pour the entire contents of packet onto a spoonful of a soft food, such as pudding or other soft, non-acidic food such as mashed potatoes (do NOT use applesauce or a fruit-based food). If two packets are required for each dose, you may either sprinkle the content of both packets on one spoonful of non-acidic food, or sprinkle the contents of both packets on two spoonfuls of non-acidic food. Make sure that no granules remain in the packet. Swallow the mxiture of the food and granules without chewing. Take your medicine at regular intervals. Do not take it more often than directed. Take all of your medicine as directed even if you think you are better. Do not skip doses or stop your medicine early. Contact your pediatrician or health care professional regarding the use of this medicine in children. While this medicine may be prescribed for children as young as 4 years for selected conditions, precautions do apply. Overdosage: If you think you have taken too much of this medicine contact a poison control  center or emergency room at once. NOTE: This medicine is only for you. Do not share this medicine with others. What if I miss a dose? If you miss a dose, take it as soon as you can. If it is almost time for your next dose, take only that dose. Do not take double or extra doses. What may interact with this medicine? Do not take this medicine with any of the following medications:  thioridazine This medicine may also interact with the following medications:  beta-blockers  caffeine  cimetidine  cyclosporine  MAOIs like Carbex, Eldepryl, Marplan, Nardil, and Parnate  medicines for fungal infections like fluconazole and ketoconazole  medicines for irregular heartbeat like amiodarone, flecainide and propafenone  rifampin  SSRIs like citalopram, escitalopram, fluoxetine, fluvoxamine, paroxetine and sertraline  tricyclic antidepressants like amitriptyline, clomipramine, desipramine, imipramine, nortriptyline, and others  warfarin This list may not describe all possible interactions. Give your health care provider a list of all the medicines, herbs, non-prescription drugs, or dietary supplements you use. Also tell them if you smoke, drink alcohol, or use illegal drugs. Some items may interact with your medicine. What should I watch for while using this medicine? Your doctor may monitor your liver function. Tell your doctor right away if you have nausea or vomiting, loss of appetite, stomach pain on your right upper side, yellow skin, dark urine, light stools, or are over tired. This medicine may cause serious skin reactions. They can happen weeks to months after starting the medicine. Contact your health care provider right away if you notice fevers or flu-like symptoms   with a rash. The rash may be red or purple and then turn into blisters or peeling of the skin. Or, you might notice a red rash with swelling of the face, lips or lymph nodes in your neck or under your arms. You need to take  this medicine for 6 weeks or longer to cure the fungal infection. Take your medicine regularly for as long as your doctor or health care provider tells you to. What side effects may I notice from receiving this medicine? Side effects that you should report to your doctor or health care professional as soon as possible:  allergic reactions like skin rash or hives, swelling of the face, lips, or tongue  change in vision  dark urine  fever or infection  general ill feeling or flu-like symptoms  light-colored stools  loss of appetite, nausea  rash, fever, and swollen lymph nodes  redness, blistering, peeling or loosening of the skin, including inside the mouth  right upper belly pain  unusually weak or tired  yellowing of the eyes or skin Side effects that usually do not require medical attention (report to your doctor or health care professional if they continue or are bothersome):  changes in taste  diarrhea  hair loss  muscle or joint pain  stomach upset This list may not describe all possible side effects. Call your doctor for medical advice about side effects. You may report side effects to FDA at 1-800-FDA-1088. Where should I keep my medicine? Keep out of the reach of children. Store at room temperature between 15 and 30 degrees C (59 and 86 degrees F). Throw away any unused medicine after the expiration date. NOTE: This sheet is a summary. It may not cover all possible information. If you have questions about this medicine, talk to your doctor, pharmacist, or health care provider.  2020 Elsevier/Gold Standard (2018-08-18 15:35:11)  

## 2020-03-24 NOTE — Progress Notes (Signed)
Subjective: 44 year old female presents the office today for follow evaluation of nail fungus on her right big toe.  She thinks it has been getting somewhat better although slowly.  She does state that the toenail is not growing much.  She denies any pain, redness or drainage to the toenail sites.  She is having no problems with the Lamisil.  She has no other concerns today. Denies any systemic complaints such as fevers, chills, nausea, vomiting. No acute changes since last appointment, and no other complaints at this time.   Objective: AAO x3, NAD DP/PT pulses palpable bilaterally, CRT less than 3 seconds Nails are polished and not able to fully evaluate but there does appear to be transverse ridging within the middle aspect of the toenail.  There is no surrounding edema, erythema or signs of infection there is no pain to the nail. No pain with calf compression, swelling, warmth, erythema  Assessment: Onychomycosis, currently on Lamisil.  Plan: -All treatment options discussed with the patient including all alternatives, risks, complications.  -At this point she has seen some improvement in nail but still growing out.  She had been on Lamisil previously after 3 months it was getting better but they stopped it and it came back.  Working continue Lamisil for 3 more days but recheck CBC and LFT.  Also recommended a biotin supplement to help the nails grow. -Patient encouraged to call the office with any questions, concerns, change in symptoms.  RTC 4-6 weeks or sooner if needed  Trula Slade DPM

## 2020-04-01 DIAGNOSIS — D229 Melanocytic nevi, unspecified: Secondary | ICD-10-CM | POA: Diagnosis not present

## 2020-04-01 DIAGNOSIS — L700 Acne vulgaris: Secondary | ICD-10-CM | POA: Diagnosis not present

## 2020-04-01 DIAGNOSIS — Z1283 Encounter for screening for malignant neoplasm of skin: Secondary | ICD-10-CM | POA: Diagnosis not present

## 2020-04-14 DIAGNOSIS — I83891 Varicose veins of right lower extremities with other complications: Secondary | ICD-10-CM | POA: Diagnosis not present

## 2020-04-15 DIAGNOSIS — Z8679 Personal history of other diseases of the circulatory system: Secondary | ICD-10-CM | POA: Diagnosis not present

## 2020-04-28 ENCOUNTER — Ambulatory Visit (INDEPENDENT_AMBULATORY_CARE_PROVIDER_SITE_OTHER): Payer: BC Managed Care – PPO | Admitting: Podiatry

## 2020-04-28 ENCOUNTER — Other Ambulatory Visit: Payer: Self-pay

## 2020-04-28 DIAGNOSIS — B351 Tinea unguium: Secondary | ICD-10-CM | POA: Diagnosis not present

## 2020-04-28 MED ORDER — NONFORMULARY OR COMPOUNDED ITEM: 3 refills | Status: DC

## 2020-04-28 MED ORDER — FLUCONAZOLE 150 MG PO TABS: 150.0000 mg | ORAL_TABLET | ORAL | 0 refills | Status: DC

## 2020-04-28 NOTE — Patient Instructions (Addendum)
I have ordered a medication for you that will come from [place] in [place]. They should be calling you to verify insurance and will mail the medication to you. If you live close by then you can go by their pharmacy to pick up the medication. Their phone number is [phone]. If you do not hear from them in the next few days, please give [place] a call at [phone].    Fluconazole tablets What is this medicine? FLUCONAZOLE (floo KON na zole) is an antifungal medicine. It is used to treat certain kinds of fungal or yeast infections. This medicine may be used for other purposes; ask your health care provider or pharmacist if you have questions. COMMON BRAND NAME(S): Diflucan What should I tell my health care provider before I take this medicine? They need to know if you have any of these conditions:  history of irregular heart beat  kidney disease  an unusual or allergic reaction to fluconazole, other azole antifungals, medicines, foods, dyes, or preservatives  pregnant or trying to get pregnant  breast-feeding How should I use this medicine? Take this medicine by mouth. Follow the directions on the prescription label. Do not take your medicine more often than directed. Talk to your pediatrician regarding the use of this medicine in children. Special care may be needed. This medicine has been used in children as young as 24 months of age. Overdosage: If you think you have taken too much of this medicine contact a poison control center or emergency room at once. NOTE: This medicine is only for you. Do not share this medicine with others. What if I miss a dose? If you miss a dose, take it as soon as you can. If it is almost time for your next dose, take only that dose. Do not take double or extra doses. What may interact with this medicine? Do not take this medicine with any of the following medications:  astemizole  certain medicines for irregular heart beat like  dronedarone, quinidine  cisapride  erythromycin  lomitapide  other medicines that prolong the QT interval (cause an abnormal heart rhythm)  pimozide  terfenadine  thioridazine This medicine may also interact with the following medications:  antiviral medicines for HIV or AIDS  birth control pills  certain antibiotics like rifabutin, rifampin  certain medicines for blood pressure like amlodipine, isradipine, felodipine, hydrochlorothiazide, losartan, nifedipine  certain medicines for cancer like cyclophosphamide, ibrutinib, vinblastine, vincristine  certain medicines for cholesterol like atorvastatin, lovastatin, fluvastatin, simvastatin  certain medicines for depression, anxiety, or psychotic disturbances like amitriptyline, midazolam, nortriptyline, triazolam  certain medicines for diabetes like glipizide, glyburide, tolbutamide  certain medicines for pain like alfentanil, fentanyl, methadone  certain medicines for seizures like carbamazepine, phenytoin  certain medicines that treat or prevent blood clots like warfarin  dofetilide  halofantrine  medicines that lower your chance of fighting infection like cyclosporine, prednisone, tacrolimus  NSAIDS, medicines for pain and inflammation, like celecoxib, diclofenac, flurbiprofen, ibuprofen, meloxicam, naproxen  other medicines for fungal infections  sirolimus  theophylline  tofacitinib  tolvaptan  ziprasidone This list may not describe all possible interactions. Give your health care provider a list of all the medicines, herbs, non-prescription drugs, or dietary supplements you use. Also tell them if you smoke, drink alcohol, or use illegal drugs. Some items may interact with your medicine. What should I watch for while using this medicine? Visit your doctor or health care professional for regular checkups. If you are taking this medicine for a long  time you may need blood work. Tell your doctor if your  symptoms do not improve. Some fungal infections need many weeks or months of treatment to cure. Alcohol can increase possible damage to your liver. Avoid alcoholic drinks. If you have a vaginal infection, do not have sex until you have finished your treatment. You can wear a sanitary napkin. Do not use tampons. Wear freshly washed cotton, not synthetic, panties. What side effects may I notice from receiving this medicine? Side effects that you should report to your doctor or health care professional as soon as possible:  allergic reactions like skin rash or itching, hives, swelling of the lips, mouth, tongue, or throat  dark urine  feeling dizzy or faint  irregular heartbeat or chest pain  redness, blistering, peeling or loosening of the skin, including inside the mouth  trouble breathing  unusual bruising or bleeding  vomiting  yellowing of the eyes or skin Side effects that usually do not require medical attention (report to your doctor or health care professional if they continue or are bothersome):  changes in how food tastes  diarrhea  headache  stomach upset or nausea This list may not describe all possible side effects. Call your doctor for medical advice about side effects. You may report side effects to FDA at 1-800-FDA-1088. Where should I keep my medicine? Keep out of the reach of children. Store at room temperature below 30 degrees C (86 degrees F). Throw away any medicine after the expiration date. NOTE: This sheet is a summary. It may not cover all possible information. If you have questions about this medicine, talk to your doctor, pharmacist, or health care provider.  2020 Elsevier/Gold Standard (2019-02-01 11:41:56)

## 2020-04-30 DIAGNOSIS — M7711 Lateral epicondylitis, right elbow: Secondary | ICD-10-CM | POA: Diagnosis not present

## 2020-05-01 NOTE — Progress Notes (Signed)
Subjective: 44 year old female presents the office today for follow-up evaluation of nail fungus.  She has been on Lamisil for 4 months and she thinks that there is about 25% improvement.  She has no pain in the nail currently no redness or drainage or any swelling. Denies any systemic complaints such as fevers, chills, nausea, vomiting. No acute changes since last appointment, and no other complaints at this time.   Objective: AAO x3, NAD DP/PT pulses palpable bilaterally, CRT less than 3 seconds Hallux toenail on her right side.  The nail appears to be dystrophic and minimally hypertrophic.  Yellow discoloration.  There is transverse ridging present within the toenail.  There is no edema, erythema to the toenail site.  No open lesions.  No pain with calf compression, swelling, warmth, erythema  Assessment: Onychomycosis  Plan: -All treatment options discussed with the patient including all alternatives, risks, complications.  -At this time she seen 25% improvement on Lamisil..  She states that this is what happened previously.  We will switch to using fluconazole weekly.  Also order compound cream today for chemotherapy..  Laser of the nail as well. -Patient encouraged to call the office with any questions, concerns, change in symptoms.   Trula Slade DPM

## 2020-05-12 DIAGNOSIS — I83811 Varicose veins of right lower extremities with pain: Secondary | ICD-10-CM | POA: Diagnosis not present

## 2020-05-13 DIAGNOSIS — I83811 Varicose veins of right lower extremities with pain: Secondary | ICD-10-CM | POA: Diagnosis not present

## 2020-06-13 ENCOUNTER — Other Ambulatory Visit: Payer: BC Managed Care – PPO

## 2020-07-14 DIAGNOSIS — I83811 Varicose veins of right lower extremities with pain: Secondary | ICD-10-CM | POA: Diagnosis not present

## 2020-07-21 ENCOUNTER — Ambulatory Visit (INDEPENDENT_AMBULATORY_CARE_PROVIDER_SITE_OTHER): Payer: BC Managed Care – PPO | Admitting: Osteopathic Medicine

## 2020-07-21 ENCOUNTER — Encounter: Payer: Self-pay | Admitting: Osteopathic Medicine

## 2020-07-21 ENCOUNTER — Other Ambulatory Visit: Payer: Self-pay

## 2020-07-21 VITALS — BP 121/64 | HR 75 | Temp 98.3°F | Wt 196.1 lb

## 2020-07-21 DIAGNOSIS — E559 Vitamin D deficiency, unspecified: Secondary | ICD-10-CM

## 2020-07-21 DIAGNOSIS — F411 Generalized anxiety disorder: Secondary | ICD-10-CM

## 2020-07-21 DIAGNOSIS — Z0000 Encounter for general adult medical examination without abnormal findings: Secondary | ICD-10-CM

## 2020-07-21 DIAGNOSIS — Z807 Family history of other malignant neoplasms of lymphoid, hematopoietic and related tissues: Secondary | ICD-10-CM

## 2020-07-21 DIAGNOSIS — E049 Nontoxic goiter, unspecified: Secondary | ICD-10-CM

## 2020-07-21 DIAGNOSIS — G4709 Other insomnia: Secondary | ICD-10-CM

## 2020-07-21 DIAGNOSIS — D509 Iron deficiency anemia, unspecified: Secondary | ICD-10-CM | POA: Diagnosis not present

## 2020-07-21 DIAGNOSIS — Z9189 Other specified personal risk factors, not elsewhere classified: Secondary | ICD-10-CM | POA: Insufficient documentation

## 2020-07-21 MED ORDER — GABAPENTIN 100 MG PO CAPS: 100.0000 mg | ORAL_CAPSULE | Freq: Every day | ORAL | 1 refills | Status: DC

## 2020-07-21 MED ORDER — DIAZEPAM 2 MG PO TABS: 2.0000 mg | ORAL_TABLET | Freq: Three times a day (TID) | ORAL | 0 refills | Status: DC | PRN

## 2020-07-21 NOTE — Progress Notes (Signed)
Felicia Jacobson is a 45 y.o. female who presents to  [place] [facility]  today, 07/21/20, seeking care for the following:  . Annual Physical  . Sleep - typically has insomnia which is well managed w/ gabapentin 300 mg + benadryl 25 mg qhs but increased family stress lately have caused more sleep maintenance issues . Going on a flight soon - requests valium for fear of planes      ASSESSMENT & PLAN with other pertinent findings:  The primary encounter diagnosis was Annual physical exam. Diagnoses of Vitamin D deficiency, Iron deficiency anemia, unspecified iron deficiency anemia type, Enlarged thyroid, Family history of multiple myeloma, Anxiety state, and Other insomnia - primary insomnia w/ additinoal situational anxiety component affecting sleep maintenance  were also pertinent to this visit.   --> increase Gabapentin to 400-500 mg qhs, let me know if not working and would consider transition to [place] or Trazodone for longer term use, may consider SSRI for underlying anxiety  --> Valium prn flying   Patient Instructions  General Preventive Care  Most recent routine screening labs: ordered today.   Blood pressure goal 130/80 or less.   Tobacco: don't!   Alcohol: responsible moderation is ok for most adults - if you have concerns about your alcohol intake, please talk to me!   Exercise: as tolerated to reduce risk of cardiovascular disease and diabetes. Strength training will also prevent osteoporosis.   Mental health: if need for mental health care (medicines, counseling, other), or concerns about moods, please let me know!   Sexual / Reproductive health: if need for STD testing, or if concerns with libido/pain problems, please let me know!  Advanced Directive: Living Will and/or Healthcare Power of Attorney recommended for all adults, regardless of age or health.  Vaccines  Flu vaccine: for almost everyone, every fall.    Shingles vaccine: after age 16.   Pneumonia vaccines: after age 80.  Tetanus booster: every 10 years  COVID vaccine: THANKS for getting your vaccine! :) BOOSTER STRONGLY RECOMMENDED  Cancer screenings   Colon cancer screening: for everyone age 2-75.   Breast cancer screening: mammogram at age 39 every other year at least, and annually after age 9.   Cervical cancer screening: Pap every 5 years if normal   Lung cancer screening: not needed for non-smokers  Infection screenings  . HIV: recommended screening at least once age 82-65, more often as needed. . Gonorrhea/Chlamydia: screening as needed . Hepatitis C: recommended once for everyone age 82-75 . TB: certain at-risk populations, or depending on work requirements and/or travel history Other . Bone Density Test: recommended for women at age 80   Orders Placed This Encounter  Procedures  . COMPLETE METABOLIC PANEL WITH GFR  . Lipid panel  . TSH  . VITAMIN D 25 Hydroxy (Vit-D Deficiency, Fractures)  . CBC with Differential/Platelet  . Fe+TIBC+Fer    Meds ordered this encounter  Medications  . gabapentin (NEURONTIN) 100 MG capsule    Sig: Take 1-2 capsules (100-200 mg total) by mouth at bedtime. In addition to 300 mg for TDD 400-500 mg    Dispense:  90 capsule    Refill:  1  . DISCONTD: diazepam (VALIUM) 2 MG tablet    Sig: Take 1 tablet (2 mg total) by mouth every 8 (eight) hours as needed for anxiety.    Dispense:  5 tablet    Refill:  0  . diazepam (VALIUM) 2 MG tablet  Sig: Take 1 tablet (2 mg total) by mouth every 8 (eight) hours as needed for anxiety.    Dispense:  5 tablet    Refill:  0     See below for relevant physical exam findings  See below for recent lab and imaging results reviewed  Medications, allergies, PMH, PSH, SocH, FamH reviewed below    Follow-up instructions: Return in about 1 year (around 07/21/2021) for ANNUAL CHECK-UP - SEE [place] SOONER IF  NEEDED.                                        Exam:  BP 121/64 (BP Location: Left Arm, Patient Position: Sitting, Cuff Size: Normal)   Pulse 75   Temp 98.3 F (36.8 C) (Oral)   Wt 196 lb 1.3 oz (88.9 kg)   BMI 29.81 kg/m   Constitutional: VS see above. General Appearance: alert, well-developed, well-nourished, NAD  Neck: No masses, trachea midline.   Respiratory: Normal respiratory effort. no wheeze, no rhonchi, no rales  Cardiovascular: S1/S2 normal, no murmur, no rub/gallop auscultated. RRR.   Musculoskeletal: Gait normal. Symmetric and independent movement of all extremities  Abdominal: non-tender, non-distended, no appreciable organomegaly, neg Murphy's, BS WNLx4  Neurological: Normal balance/coordination. No tremor.  Skin: warm, dry, intact.   Psychiatric: Normal judgment/insight. Normal mood and affect. Oriented x3.   Current Meds  Medication Sig  . gabapentin (NEURONTIN) 100 MG capsule Take 1-2 capsules (100-200 mg total) by mouth at bedtime. In addition to 300 mg for TDD 400-500 mg  . [DISCONTINUED] diazepam (VALIUM) 2 MG tablet Take 1 tablet (2 mg total) by mouth every 8 (eight) hours as needed for anxiety.    No Known Allergies  Patient Active Problem List   Diagnosis Date Noted  . Relies on partner vasectomy for contraception 07/21/2020  . Other insomnia - primary insomnia w/ additinoal situational anxiety component affecting sleep maintenance  07/21/2020  . Anxiety state 07/21/2020  . Onychomycosis 11/17/2019  . IDA (iron deficiency anemia) 08/16/2019  . Anemia due to acute blood loss 08/16/2019  . Pain in right knee 01/25/2018  . Shoulder strain 08/15/2017  . Vitamin D deficiency 12/13/2016  . Enlarged thyroid 12/13/2016    Family History  Problem Relation Age of Onset  . Hyperlipidemia Mother   . Hypertension Mother   . Cancer Father   . Hyperlipidemia Father   . Hypertension Father   . Multiple myeloma  Father   . Breast cancer Maternal Grandmother 72  . Breast cancer Paternal Grandmother 9    Social History   Tobacco Use  Smoking Status Never Smoker  Smokeless Tobacco Never Used    Past Surgical History:  Procedure Laterality Date  . COLPOSCOPY  2003  . KNEE ARTHROSCOPY Right    1997, 2014, 02/2018    Immunization History  Administered Date(s) Administered  . Influenza,inj,Quad PF,6+ Mos 03/26/2019  . Influenza,inj,quad, With Preservative 02/21/2017  . Influenza-Unspecified 03/21/2018, 03/07/2020  . PFIZER(Purple Top)SARS-COV-2 Vaccination 06/08/2019, 06/29/2019    No results found for this or any previous visit (from the past 2160 hour(s)).  No results found.     All questions at time of visit were answered - patient instructed to contact office with any additional concerns or updates. ER/RTC precautions were reviewed with the patient as applicable.   Please note: manual typing as well as voice recognition software may have been used to produce  this document - typos may escape review. Please contact Dr. Sheppard Coil for any needed clarifications.   Total encounter time on date of service, 07/21/20, was 20 minutes spent addressing problems: INSOMNIA / ANXIETY, including time spent in discussion with patient regarding the HPI, ROS, confirming history, reviewing Assessment & Plan, as well as time spent on coordination of care, record review.

## 2020-07-21 NOTE — Patient Instructions (Addendum)
General Preventive Care  Most recent routine screening labs: ordered today.   Blood pressure goal 130/80 or less.   Tobacco: don't!   Alcohol: responsible moderation is ok for most adults - if you have concerns about your alcohol intake, please talk to me!   Exercise: as tolerated to reduce risk of cardiovascular disease and diabetes. Strength training will also prevent osteoporosis.   Mental health: if need for mental health care (medicines, counseling, other), or concerns about moods, please let me know!   Sexual / Reproductive health: if need for STD testing, or if concerns with libido/pain problems, please let me know!  Advanced Directive: Living Will and/or Healthcare Power of Attorney recommended for all adults, regardless of age or health.  Vaccines  Flu vaccine: for almost everyone, every fall.   Shingles vaccine: after age 32.   Pneumonia vaccines: after age 36.  Tetanus booster: every 10 years  COVID vaccine: THANKS for getting your vaccine! :) BOOSTER STRONGLY RECOMMENDED  Cancer screenings   Colon cancer screening: for everyone age 68-75.   Breast cancer screening: mammogram at age 51 every other year at least, and annually after age 31.   Cervical cancer screening: Pap every 5 years if normal   Lung cancer screening: not needed for non-smokers  Infection screenings  . HIV: recommended screening at least once age 27-65, more often as needed. . Gonorrhea/Chlamydia: screening as needed . Hepatitis C: recommended once for everyone age 9-75 . TB: certain at-risk populations, or depending on work requirements and/or travel history Other . Bone Density Test: recommended for women at age 36

## 2020-07-24 ENCOUNTER — Other Ambulatory Visit: Payer: Self-pay | Admitting: Osteopathic Medicine

## 2020-07-24 NOTE — Telephone Encounter (Signed)
[facility] requesting med refill for gabapentin. Written by historical provider.

## 2020-07-24 NOTE — Telephone Encounter (Signed)
Sent by me 3 days ago

## 2020-07-25 ENCOUNTER — Other Ambulatory Visit: Payer: Self-pay

## 2020-07-25 MED ORDER — GABAPENTIN 100 MG PO CAPS: 100.0000 mg | ORAL_CAPSULE | Freq: Every day | ORAL | 1 refills | Status: DC

## 2020-07-28 ENCOUNTER — Telehealth: Payer: Self-pay

## 2020-07-28 ENCOUNTER — Encounter: Payer: Self-pay | Admitting: Osteopathic Medicine

## 2020-07-28 NOTE — Telephone Encounter (Signed)
See mychart message - pt asking or 600 mg?

## 2020-07-28 NOTE — Telephone Encounter (Signed)
[facility] called in regards to a missing rx for gabapentin. Per provider's note - patient is should also be taking 300 mg for a TDD 400-500 mg. Pls send in a rx for 300 mg.

## 2020-07-29 ENCOUNTER — Ambulatory Visit: Payer: BC Managed Care – PPO | Admitting: Podiatry

## 2020-07-30 ENCOUNTER — Other Ambulatory Visit: Payer: Self-pay | Admitting: Osteopathic Medicine

## 2020-07-30 MED ORDER — GABAPENTIN 600 MG PO TABS: 300.0000 mg | ORAL_TABLET | Freq: Every day | ORAL | 1 refills | Status: AC

## 2020-07-30 NOTE — Telephone Encounter (Signed)
Per pt/pharmacy - pt said that she spoke with provider about having 2 separate rxs for gabapentin. One for 100 mg and the other for 600 mg. Pt has been calling multiple times to fix this problem. Pls advise, thanks.

## 2020-10-02 ENCOUNTER — Other Ambulatory Visit: Payer: Self-pay | Admitting: Osteopathic Medicine

## 2020-10-02 DIAGNOSIS — Z1231 Encounter for screening mammogram for malignant neoplasm of breast: Secondary | ICD-10-CM | POA: Diagnosis not present

## 2020-10-09 ENCOUNTER — Encounter (HOSPITAL_BASED_OUTPATIENT_CLINIC_OR_DEPARTMENT_OTHER): Payer: Self-pay | Admitting: Obstetrics & Gynecology

## 2020-10-09 ENCOUNTER — Ambulatory Visit (INDEPENDENT_AMBULATORY_CARE_PROVIDER_SITE_OTHER): Payer: BC Managed Care – PPO | Admitting: Obstetrics & Gynecology

## 2020-10-09 ENCOUNTER — Other Ambulatory Visit: Payer: Self-pay

## 2020-10-09 ENCOUNTER — Other Ambulatory Visit (HOSPITAL_COMMUNITY)
Admission: RE | Admit: 2020-10-09 | Discharge: 2020-10-09 | Disposition: A | Discharge status: Home / Self Care | Payer: BC Managed Care – PPO | Source: Ambulatory Visit | Attending: Obstetrics & Gynecology | Admitting: Obstetrics & Gynecology

## 2020-10-09 VITALS — BP 111/64 | HR 71 | Ht 68.25 in | Wt 199.0 lb

## 2020-10-09 DIAGNOSIS — Z9189 Other specified personal risk factors, not elsewhere classified: Secondary | ICD-10-CM | POA: Diagnosis not present

## 2020-10-09 DIAGNOSIS — Z01419 Encounter for gynecological examination (general) (routine) without abnormal findings: Secondary | ICD-10-CM

## 2020-10-09 DIAGNOSIS — Z86018 Personal history of other benign neoplasm: Secondary | ICD-10-CM | POA: Diagnosis not present

## 2020-10-09 DIAGNOSIS — Z862 Personal history of diseases of the blood and blood-forming organs and certain disorders involving the immune mechanism: Secondary | ICD-10-CM | POA: Diagnosis not present

## 2020-10-09 DIAGNOSIS — N926 Irregular menstruation, unspecified: Secondary | ICD-10-CM

## 2020-10-09 DIAGNOSIS — Z124 Encounter for screening for malignant neoplasm of cervix: Secondary | ICD-10-CM | POA: Insufficient documentation

## 2020-10-09 DIAGNOSIS — Z1151 Encounter for screening for human papillomavirus (HPV): Secondary | ICD-10-CM | POA: Insufficient documentation

## 2020-10-09 DIAGNOSIS — N923 Ovulation bleeding: Secondary | ICD-10-CM | POA: Insufficient documentation

## 2020-10-09 DIAGNOSIS — Z8742 Personal history of other diseases of the female genital tract: Secondary | ICD-10-CM | POA: Diagnosis not present

## 2020-10-09 NOTE — Progress Notes (Signed)
45 y.o. [id number] Married White or Caucasian female here for annual exam.  Doing well.  Cycles are regular but mid cycle spotting has started again.  Has ultrasound in 2019 with normal appearing endometrium.    They are going to move to [place] in July.     Patient's last menstrual period was 10/02/2020.          Sexually active: Yes.    The current method of family planning is vasectomy.    Exercising: Yes.    Smoker:  no  Health Maintenance: Pap:  Obtained today History of abnormal Pap:  no MMG:  2022 Colonoscopy:  Guidelines reviewed TDaP:  2013 Hep C testing: not done Screening Labs: ordered placed in 06/2020 by Dr. Sheppard Coil.  Not completed yet.   reports that she has never smoked. She has never used smokeless tobacco. She reports that she does not drink alcohol and does not use drugs.  Past Medical History:  Diagnosis Date  . Abnormal Pap smear of cervix 2003  . Abnormal uterine bleeding   . Dysmenorrhea   . Heart murmur     Past Surgical History:  Procedure Laterality Date  . COLPOSCOPY  2003  . KNEE ARTHROSCOPY Right    1997, 2014, 02/2018    Current Outpatient Medications  Medication Sig Dispense Refill  . diphenhydrAMINE (BENADRYL) 25 MG tablet Take 25 mg by mouth every 8 (eight) hours as needed.    . gabapentin (NEURONTIN) 100 MG capsule TAKE 1-2 CAPSULES BY MOUTH AT BEDTIME. IN ADDITION TO 300MG FOR TOTAL DOSE OF 400-500 MG 90 capsule 1  . gabapentin (NEURONTIN) 600 MG tablet Take 0.5 tablets (300 mg total) by mouth at bedtime. 90 tablet 1  . tretinoin (RETIN-A) 0.025 % cream Apply a thin layer to the face nightly as tolerated.     No current facility-administered medications for this visit.    Family History  Problem Relation Age of Onset  . Hyperlipidemia Mother   . Hypertension Mother   . Cancer Father   . Hyperlipidemia Father   . Hypertension Father   . Multiple myeloma Father   . Breast cancer Maternal Grandmother 1  . Breast cancer Paternal  Grandmother 42    Review of Systems  Constitutional: Negative.   Psychiatric/Behavioral: Positive for sleep disturbance.    Exam:   BP 111/64   Pulse 71   Ht 5' 8.25" (1.734 m)   Wt 199 lb (90.3 kg)   LMP 10/02/2020   BMI 30.04 kg/m   Height: 5' 8.25" (173.4 cm)  General appearance: alert, cooperative and appears stated age Head: Normocephalic, without obvious abnormality, atraumatic Neck: no adenopathy, supple, symmetrical, trachea midline and thyroid normal to inspection and palpation Lungs: clear to auscultation bilaterally Breasts: normal appearance, no masses or tenderness Heart: regular rate and rhythm Abdomen: soft, non-tender; bowel sounds normal; no masses,  no organomegaly Extremities: extremities normal, atraumatic, no cyanosis or edema Skin: Skin color, texture, turgor normal. No rashes or lesions Lymph nodes: Cervical, supraclavicular, and axillary nodes normal. No abnormal inguinal nodes palpated Neurologic: Grossly normal  Pelvic: External genitalia:  no lesions              Urethra:  normal appearing urethra with no masses, tenderness or lesions              Bartholins and Skenes: normal                 Vagina: normal appearing vagina with normal color and no  discharge, no lesions              Cervix: no lesions              Pap taken: Yes.   Bimanual Exam:  Uterus:  normal size, contour, position, consistency, mobility, non-tender              Adnexa: normal adnexa and no mass, fullness, tenderness               Rectovaginal: Confirms               Anus:  normal sphincter tone, no lesions  Endometrial biopsy recommended.  Discussed with patient.  Verbal and written consent obtained.   Procedure:  Speculum placed.  Cervix visualized and cleansed with betadine prep.  A single toothed tenaculum was applied to the anterior lip of the cervix.  Endometrial pipelle was advanced through the cervix into the endometrial cavity without difficulty.  Pipelle passed to  8cm.  Suction applied and pipelle removed with good tissue sample obtained.  Tenculum removed.  No bleeding noted.  Patient tolerated procedure well.  Chaperone, Octaviano Batty, CMA, was present for exam.  Assessment/Plan: 1. Well woman exam with routine gynecological exam - Pap smear with HR HPV obtained today - MMG 2022 - Colonoscopy screening guidelines reviewed - vaccines updated - lab work placed by Dr. Sheppard Coil.  Pt just needs to go and have completed  2. Irregular bleeding - Surgical pathology( / [facility])  3. Relies on partner vasectomy for contraception  4. History of iron deficiency anemia

## 2020-10-10 ENCOUNTER — Ambulatory Visit: Payer: BC Managed Care – PPO

## 2020-10-11 ENCOUNTER — Encounter (HOSPITAL_BASED_OUTPATIENT_CLINIC_OR_DEPARTMENT_OTHER): Payer: Self-pay | Admitting: Obstetrics & Gynecology

## 2020-10-13 LAB — CYTOLOGY - PAP
Comment: NEGATIVE
Diagnosis: NEGATIVE
High risk HPV: NEGATIVE

## 2020-10-13 LAB — SURGICAL PATHOLOGY

## 2020-10-21 ENCOUNTER — Encounter: Payer: Self-pay | Admitting: Osteopathic Medicine

## 2020-10-23 ENCOUNTER — Encounter (HOSPITAL_BASED_OUTPATIENT_CLINIC_OR_DEPARTMENT_OTHER): Payer: Self-pay | Admitting: Obstetrics & Gynecology

## 2020-10-23 LAB — HM MAMMOGRAPHY

## 2020-10-29 LAB — LIPID PANEL
Cholesterol: 211 mg/dL — ABNORMAL HIGH (ref <200)
HDL: 69 mg/dL (ref >=50)
LDL Cholesterol (Calc): 128 mg/dL (calc) — ABNORMAL HIGH
Non-HDL Cholesterol (Calc): 142 mg/dL (calc) — ABNORMAL HIGH (ref <130)
Total CHOL/HDL Ratio: 3.1 (calc) (ref <5.0)
Triglycerides: 51 mg/dL (ref <150)

## 2020-10-29 LAB — CBC WITH DIFFERENTIAL/PLATELET
Absolute Monocytes: 323 cells/uL (ref 200–950)
Basophils Absolute: 20 cells/uL (ref 0–200)
Basophils Relative: 0.4 %
Eosinophils Absolute: 78 cells/uL (ref 15–500)
Eosinophils Relative: 1.6 %
HCT: 46.6 % — ABNORMAL HIGH (ref 35.0–45.0)
Hemoglobin: 14.9 g/dL (ref 11.7–15.5)
Lymphs Abs: 1842 cells/uL (ref 850–3900)
MCH: 28.5 pg (ref 27.0–33.0)
MCHC: 32 g/dL (ref 32.0–36.0)
MCV: 89.1 fL (ref 80.0–100.0)
MPV: 10.5 fL (ref 7.5–12.5)
Monocytes Relative: 6.6 %
Neutro Abs: 2636 cells/uL (ref 1500–7800)
Neutrophils Relative %: 53.8 %
Platelets: 211 10*3/uL (ref 140–400)
RBC: 5.23 10*6/uL — ABNORMAL HIGH (ref 3.80–5.10)
RDW: 12.3 % (ref 11.0–15.0)
Total Lymphocyte: 37.6 %
WBC: 4.9 10*3/uL (ref 3.8–10.8)

## 2020-10-29 LAB — COMPLETE METABOLIC PANEL WITH GFR
AG Ratio: 1.8 (calc) (ref 1.0–2.5)
ALT: 19 U/L (ref 6–29)
AST: 18 U/L (ref 10–30)
Albumin: 4.8 g/dL (ref 3.6–5.1)
Alkaline phosphatase (APISO): 48 U/L (ref 31–125)
BUN: 21 mg/dL (ref 7–25)
CO2: 28 mmol/L (ref 20–32)
Calcium: 10.1 mg/dL (ref 8.6–10.2)
Chloride: 103 mmol/L (ref 98–110)
Creat: 0.81 mg/dL (ref 0.50–1.10)
GFR, Est African American: 103 mL/min/{1.73_m2} (ref >=60)
GFR, Est Non African American: 89 mL/min/{1.73_m2} (ref >=60)
Globulin: 2.6 g/dL (calc) (ref 1.9–3.7)
Glucose, Bld: 77 mg/dL (ref 65–99)
Potassium: 4.4 mmol/L (ref 3.5–5.3)
Sodium: 140 mmol/L (ref 135–146)
Total Bilirubin: 0.5 mg/dL (ref 0.2–1.2)
Total Protein: 7.4 g/dL (ref 6.1–8.1)

## 2020-10-29 LAB — IRON,TIBC AND FERRITIN PANEL
%SAT: 27 % (calc) (ref 16–45)
Ferritin: 75 ng/mL (ref 16–232)
Iron: 85 ug/dL (ref 40–190)
TIBC: 314 mcg/dL (calc) (ref 250–450)

## 2020-10-29 LAB — VITAMIN D 25 HYDROXY (VIT D DEFICIENCY, FRACTURES): Vit D, 25-Hydroxy: 38 ng/mL (ref 30–100)

## 2020-10-29 LAB — TSH: TSH: 2.04 mIU/L

## 2020-11-26 ENCOUNTER — Telehealth: Payer: BC Managed Care – PPO | Admitting: Physician Assistant

## 2020-11-26 ENCOUNTER — Encounter: Payer: Self-pay | Admitting: Physician Assistant

## 2020-11-26 ENCOUNTER — Encounter: Payer: BC Managed Care – PPO | Admitting: Physician Assistant

## 2020-11-26 DIAGNOSIS — U071 COVID-19: Secondary | ICD-10-CM | POA: Diagnosis not present

## 2020-11-26 DIAGNOSIS — J029 Acute pharyngitis, unspecified: Secondary | ICD-10-CM

## 2020-11-26 MED ORDER — AMOXICILLIN 875 MG PO TABS: 875.0000 mg | ORAL_TABLET | Freq: Two times a day (BID) | ORAL | 0 refills | Status: DC

## 2020-11-26 MED ORDER — NIRMATRELVIR/RITONAVIR (PAXLOVID)TABLET: 3.0000 | ORAL_TABLET | Freq: Two times a day (BID) | ORAL | 0 refills | Status: AC

## 2020-11-26 MED ORDER — LIDOCAINE VISCOUS HCL 2 % MT SOLN: 15.0000 mL | OROMUCOSAL | 0 refills | Status: AC | PRN

## 2020-11-26 NOTE — Patient Instructions (Signed)
  Vicki Mallet, thank you for joining [name], PA-C for today's virtual visit.  While this provider is not your primary care provider (PCP), if your PCP is located in our provider database this encounter information will be shared with them immediately following your visit.  Consent: (Patient) Ivadell Gaul provided verbal consent for this virtual visit at the beginning of the encounter.  Current Medications:  Current Outpatient Medications:    diphenhydrAMINE (BENADRYL) 25 MG tablet, Take 25 mg by mouth every 8 (eight) hours as needed., Disp: , Rfl:    gabapentin (NEURONTIN) 100 MG capsule, TAKE 1-2 CAPSULES BY MOUTH AT BEDTIME. IN ADDITION TO 300MG  FOR TOTAL DOSE OF 400-500 MG, Disp: 90 capsule, Rfl: 1   gabapentin (NEURONTIN) 600 MG tablet, Take 0.5 tablets (300 mg total) by mouth at bedtime., Disp: 90 tablet, Rfl: 1   tretinoin (RETIN-A) 0.025 % cream, Apply a thin layer to the face nightly as tolerated., Disp: , Rfl:    Medications ordered in this encounter:  No orders of the defined types were placed in this encounter.    *If you need refills on other medications prior to your next appointment, please contact your pharmacy*  Follow-Up: Call back or seek an in-person evaluation if the symptoms worsen or if the condition fails to improve as anticipated.  Other Instructions Please keep well-hydrated and get plenty of rest. Alternate tylenol and ibuprofen as needed for throat pain. Lozenges, salt-water gargles and running a humidifier in the bedroom can also be beneficial. Avoid dairy when possible.  You can use the prescription mouthwash to gargle and help numb the throat.  Take antibiotic as directed with food.  Feel better soon!   If you have been instructed to have an in-person evaluation today at a local Urgent Care facility, please use the link below. It will take you to a list of all of our available [facility], including address, phone number  and hours of operation. Please do not delay care.  [facility]  If you or a family member do not have a primary care provider, use the link below to schedule a visit and establish care. When you choose a [facility] primary care physician or advanced practice provider, you gain a long-term partner in health. Find a Primary Care Provider  Learn more about [facility] in-office and virtual care options: [facility]

## 2020-11-26 NOTE — Addendum Note (Signed)
Addended by: Brunetta Jeans on: 11/26/2020 12:26 PM   Modules accepted: Orders

## 2020-11-26 NOTE — Progress Notes (Addendum)
Virtual Visit Consent   Felicia Jacobson, you are scheduled for a virtual visit with a [facility] provider today.     Just as with appointments in the office, your consent must be obtained to participate.  Your consent will be active for this visit and any virtual visit you may have with one of our providers in the next 365 days.     If you have a MyChart account, a copy of this consent can be sent to you electronically.  All virtual visits are billed to your insurance company just like a traditional visit in the office.    As this is a virtual visit, video technology does not allow for your provider to perform a traditional examination.  This may limit your provider's ability to fully assess your condition.  If your provider identifies any concerns that need to be evaluated in person or the need to arrange testing (such as labs, EKG, etc.), we will make arrangements to do so.     Although advances in technology are sophisticated, we cannot ensure that it will always work on either your end or our end.  If the connection with a video visit is poor, the visit may have to be switched to a telephone visit.  With either a video or telephone visit, we are not always able to ensure that we have a secure connection.     I need to obtain your verbal consent now.   Are you willing to proceed with your visit today?    [name] has provided verbal consent on 11/26/2020 for a virtual visit (video or telephone).   Felicia Jacobson, [place]   Date: 11/26/2020 12:23 PM   Virtual Visit via Video Note   I, Felicia Jacobson, connected with  Markisha Meding  ([id number], 10-11-1975) on 11/26/20 at 11:15 AM EDT by a video-enabled telemedicine application and verified that I am speaking with the correct person using two identifiers.  Location: Patient: Virtual Visit Location Patient: Home Provider: Virtual Visit Location Provider: Home Office   I discussed the limitations of evaluation and management  by telemedicine and the availability of in person appointments. The patient expressed understanding and agreed to proceed.    History of Present Illness: Felicia Jacobson is a 45 y.o. who identifies as a female who was assigned female at birth, and is being seen today for significant and progressively worsening sore throat over the past several days. Initially thought she was getting a viral infection but has not developed any congestion, cough or other significant URI symptom. Notes odynophagia but denies any dysphagia itself. Now with fever with Tmax 101. Denies recent travel. 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 Has taken two COVID tests which have both been negative. Has significant history of bacterial pharyngitis even as an adult and is concerned for this.  Addendum -- Patient called back noting she has tested positive for COVID as of just a bit ago.   HPI: HPI  Problems:  Patient Active Problem List   Diagnosis Date Noted   Relies on partner vasectomy for contraception 07/21/2020   Other insomnia - primary insomnia w/ additinoal situational anxiety component affecting sleep maintenance  07/21/2020   Anxiety state 07/21/2020   Onychomycosis 11/17/2019   IDA (iron deficiency anemia) 08/16/2019   Anemia due to acute blood loss 08/16/2019   Pain in right knee 01/25/2018   Shoulder strain 08/15/2017   Vitamin D deficiency 12/13/2016  Enlarged thyroid 12/13/2016    Allergies: No Known Allergies Medications:  Current Outpatient Medications:    lidocaine (XYLOCAINE) 2 % solution, Use as directed 15 mLs in the mouth or throat as needed for mouth pain., Disp: 200 mL, Rfl: 0   nirmatrelvir/ritonavir EUA (PAXLOVID) TABS, Take 3 tablets by mouth 2 (two) times daily for 5 days. (Take nirmatrelvir 150 mg two tablets twice daily for 5 days and ritonavir 100 mg one tablet twice daily for 5 days)Patient GFR is 89, Disp: 30 tablet, Rfl: 0    diphenhydrAMINE (BENADRYL) 25 MG tablet, Take 25 mg by mouth every 8 (eight) hours as needed., Disp: , Rfl:    gabapentin (NEURONTIN) 100 MG capsule, TAKE 1-2 CAPSULES BY MOUTH AT BEDTIME. IN ADDITION TO 300MG  FOR TOTAL DOSE OF 400-500 MG, Disp: 90 capsule, Rfl: 1   gabapentin (NEURONTIN) 600 MG tablet, Take 0.5 tablets (300 mg total) by mouth at bedtime., Disp: 90 tablet, Rfl: 1   tretinoin (RETIN-A) 0.025 % cream, Apply a thin layer to the face nightly as tolerated., Disp: , Rfl:   Observations/Objective: Patient is well-developed, well-nourished in no acute distress.  Resting comfortably at home.  Head is normocephalic, atraumatic.  No labored breathing. Speech is clear and coherent with logical content.  Patient is alert and oriented at baseline.  Tenderness of anterior neck in submandibular regions bilaterally. She does not feel any isolated adenopathy on self exam.   Assessment and Plan: 1. Pharyngitis, unspecified etiology - lidocaine (XYLOCAINE) 2 % solution; Use as directed 15 mLs in the mouth or throat as needed for mouth pain.  Dispense: 200 mL; Refill: 0  2. COVID-19 - MyChart COVID-19 home monitoring program; Future - nirmatrelvir/ritonavir EUA (PAXLOVID) TABS; Take 3 tablets by mouth 2 (two) times daily for 5 days. (Take nirmatrelvir 150 mg two tablets twice daily for 5 days and ritonavir 100 mg one tablet twice daily for 5 days)Patient GFR is 89  Dispense: 30 tablet; Refill: 0  Giving severe sore throat will add-on Rx viscous lidocaine. Recommend antihistamine and alternating tylenol and ibuprofen. Supportive measures, OTC medications and Vitamin regimen reviewed with patient. Patient enrolled in [facility] monitoring program. Quarantine reviewed. Giving significant symptoms and patient request for antiviral, will Rx Paxlovid after review of recent GFR. Quarantine reviewed with patient. Strict ER precautions reviewed with patient.   Follow Up Instructions: I discussed the  assessment and treatment plan with the patient. The patient was provided an opportunity to ask questions and all were answered. The patient agreed with the plan and demonstrated an understanding of the instructions.  A copy of instructions were sent to the patient via MyChart.  The patient was advised to call back or seek an in-person evaluation if the symptoms worsen or if the condition fails to improve as anticipated.  Time:  I spent 10 minutes with the patient via telehealth technology discussing the above problems/concerns.    Felicia Rio, PA-C

## 2020-11-26 NOTE — Progress Notes (Signed)
Duplicate encounter. No charge

## 2021-01-30 ENCOUNTER — Encounter (HOSPITAL_BASED_OUTPATIENT_CLINIC_OR_DEPARTMENT_OTHER): Payer: Self-pay

## 2021-02-02 ENCOUNTER — Other Ambulatory Visit (HOSPITAL_BASED_OUTPATIENT_CLINIC_OR_DEPARTMENT_OTHER): Payer: Self-pay | Admitting: *Deleted

## 2021-02-02 MED ORDER — NITROFURANTOIN MONOHYD MACRO 100 MG PO CAPS: ORAL_CAPSULE | ORAL | 2 refills | Status: AC

## 2021-04-17 ENCOUNTER — Other Ambulatory Visit: Payer: Self-pay | Admitting: Osteopathic Medicine

## 2021-04-19 IMAGING — US US THYROID
1 series · 14 of 25 positions shown · non-contrast
Comparison: None.

CLINICAL DATA: Thyroid nodules.

EXAM:
THYROID ULTRASOUND
TECHNIQUE: Ultrasound examination of the thyroid gland and adjacent soft
tissues was performed.

[Series 1: us thyroid · 0.05mm/px · 14 of 58 slices shown]
[im 1/58]
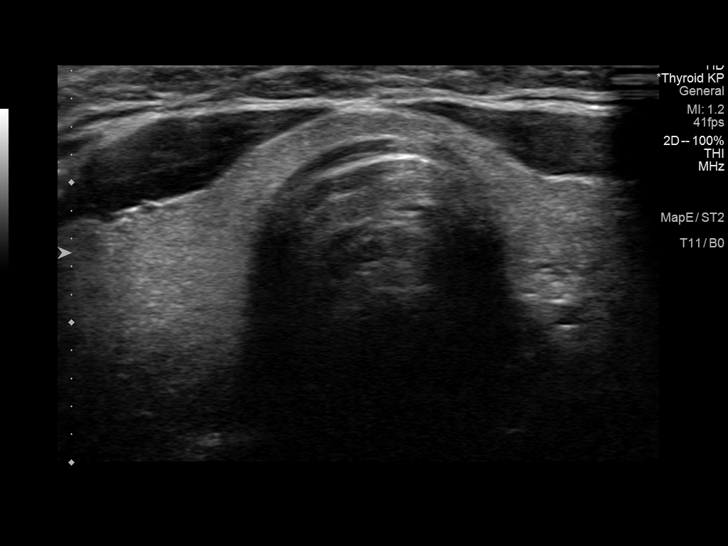
[im 5/58]
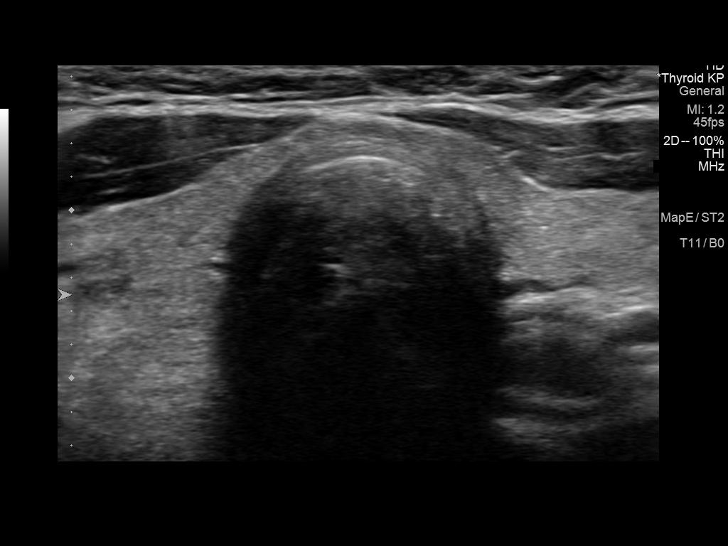
[im 10/58]
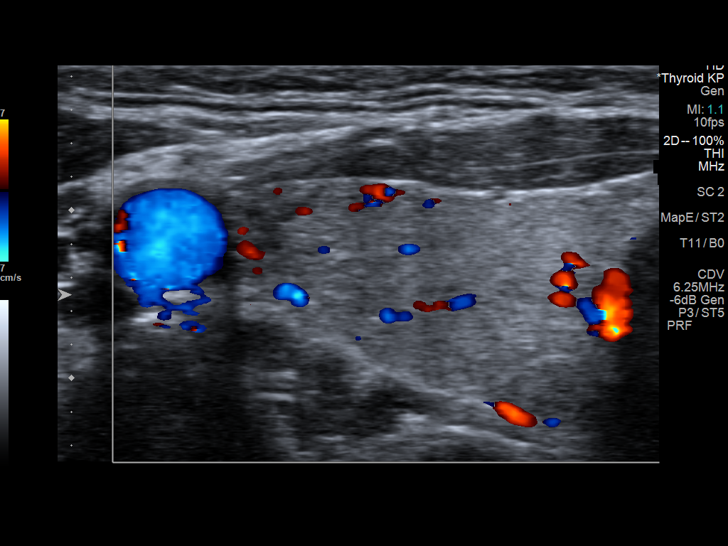
[im 15/58]
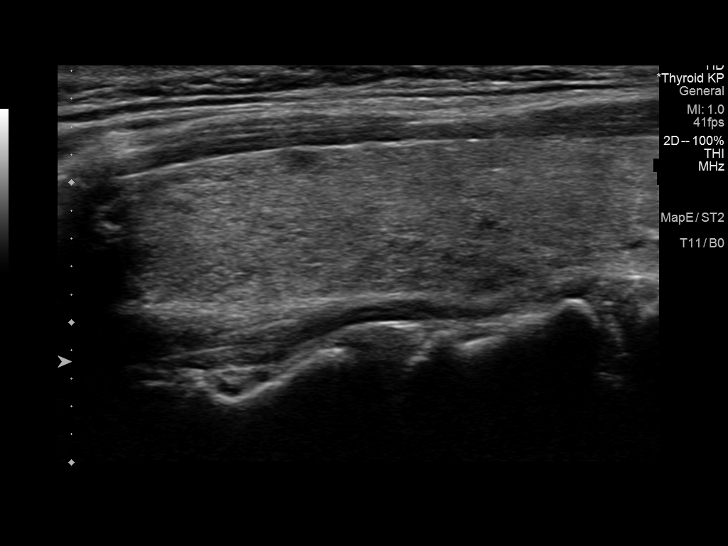
[im 20/58]
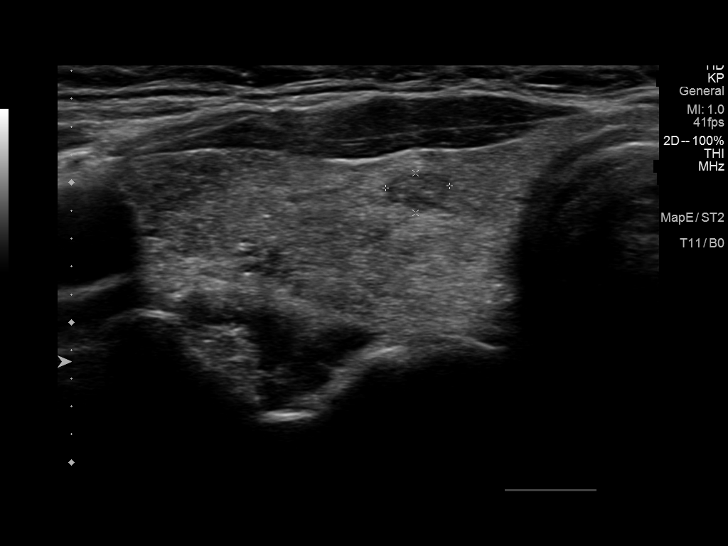
[im 22/58]
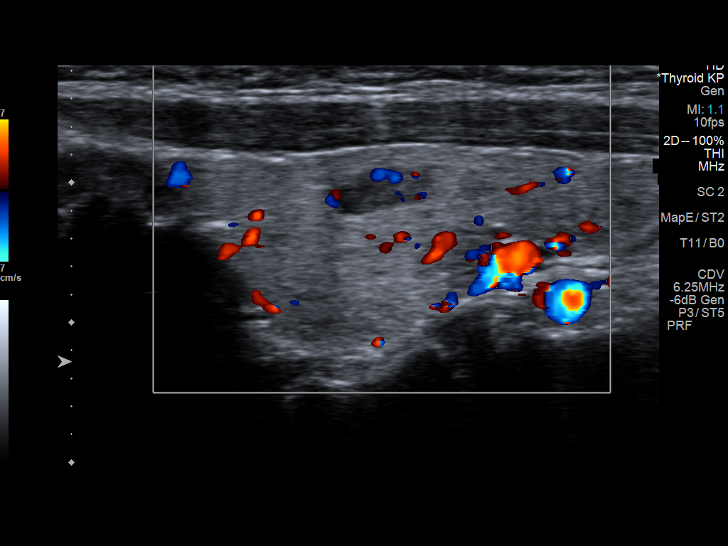
[im 27/58]
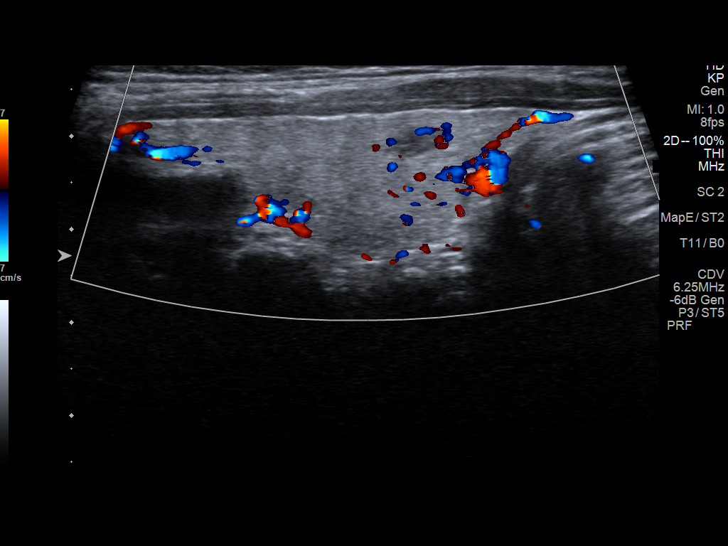
[im 31/58]
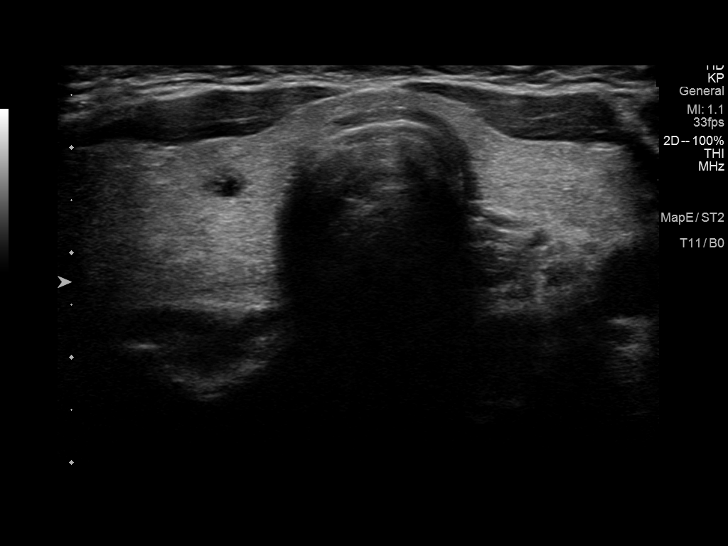
[im 36/58]
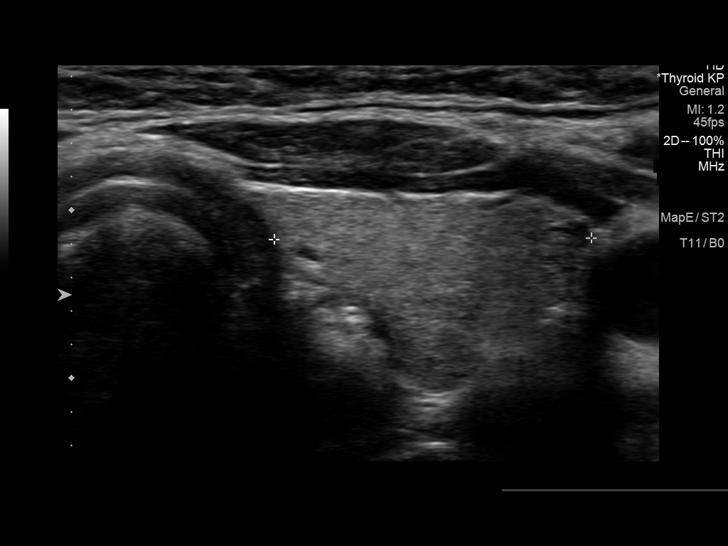
[im 39/58]
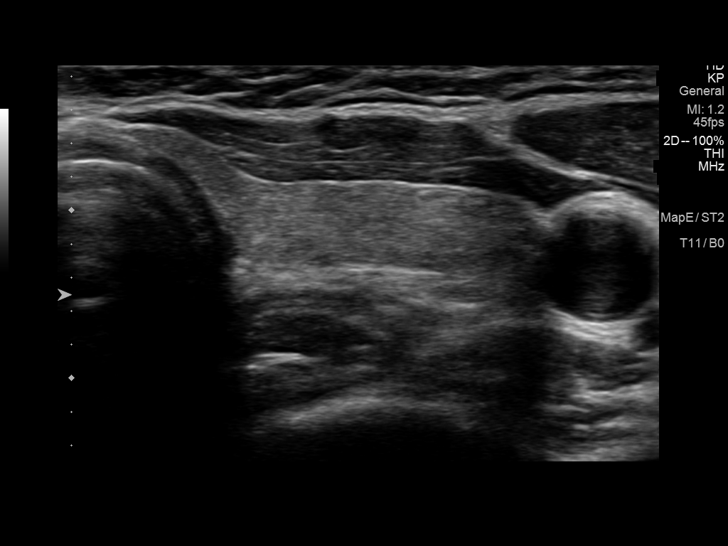
[im 43/58]
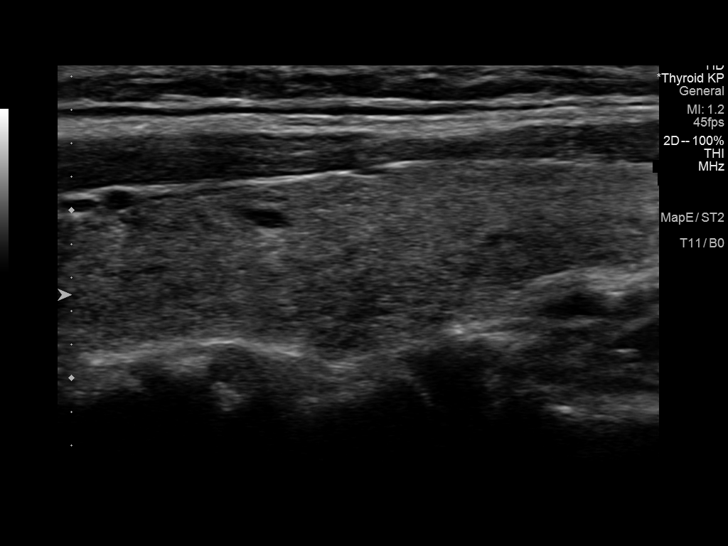
[im 48/58]
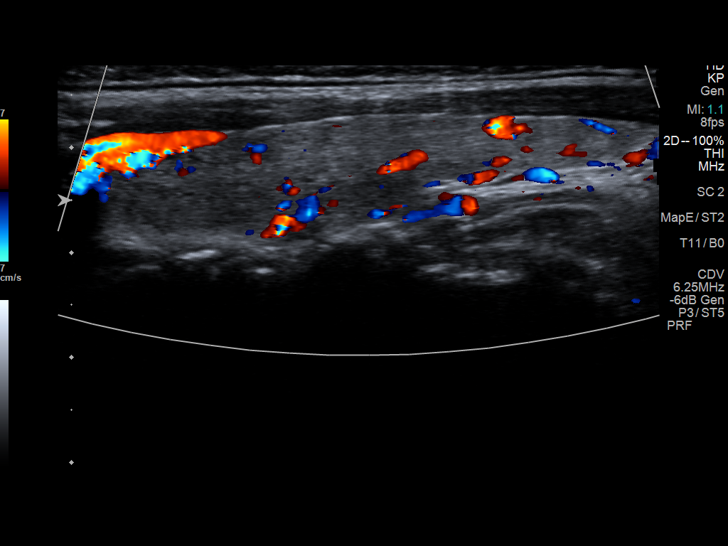
[im 53/58]
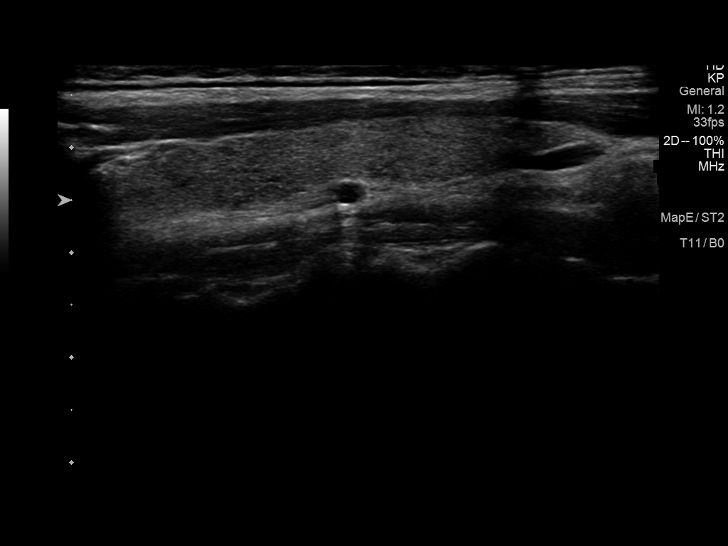
[im 58/58]
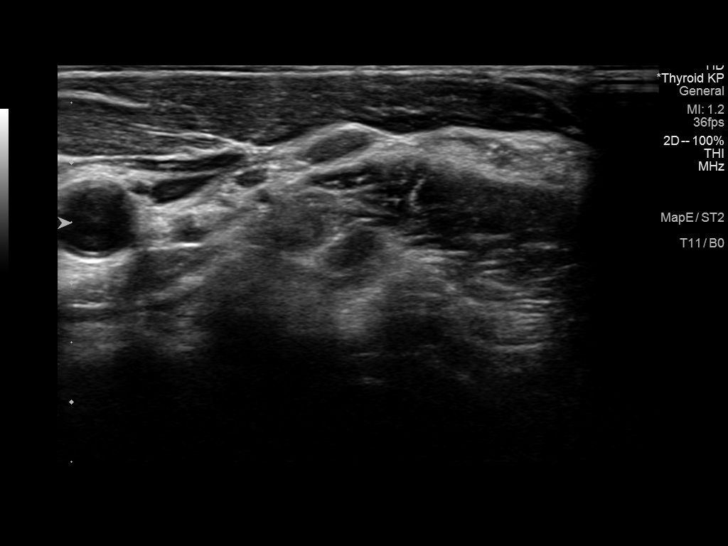

[14 of 25 positions shown; findings below may reference images not displayed]

FINDINGS: Parenchymal Echotexture: Mildly heterogenous

Isthmus: 0.2 cm

Right lobe: 5.6 x 1.5 x 2.2 cm

Left lobe: 5.7 x 1.1 x 1.9 cm

_________________________________________________________

Estimated total number of nodules >/= 1 cm: 0

Number of spongiform nodules >/=  2 cm not described below (TR1): 0

Number of mixed cystic and solid nodules >/= 1.5 cm not described
below (TR2): 0

_________________________________________________________

There is a 0.7 cm in the right mid thyroid gland. This does not meet
criteria for FNA or follow-up ultrasound. No additional distinct
thyroid nodules were identified on this study.
IMPRESSION: 1. Mildly heterogeneous, mildly enlarged thyroid gland as detailed
above.
2. There is a single distinct subcentimeter thyroid nodule in the
right that does not meet criteria for FNA or follow-up ultrasound.

The above is in keeping with the ACR TI-RADS recommendations - [HOSPITAL] 0266;[DATE].

## 2021-07-22 ENCOUNTER — Encounter: Payer: BC Managed Care – PPO | Admitting: Medical-Surgical

## 2021-07-22 ENCOUNTER — Encounter: Payer: BC Managed Care – PPO | Admitting: Osteopathic Medicine
